# Patient Record
Sex: Male | Born: 1975 | Race: White | Hispanic: No | Marital: Married | Smoking: Never smoker
Health system: Southern US, Community
[De-identification: ages and names within clinical notes are randomized; demographics above are authoritative.]

## PROBLEM LIST (undated history)

## (undated) DIAGNOSIS — I10 Essential (primary) hypertension: Secondary | ICD-10-CM

## (undated) DIAGNOSIS — G4700 Insomnia, unspecified: Secondary | ICD-10-CM

## (undated) DIAGNOSIS — F419 Anxiety disorder, unspecified: Secondary | ICD-10-CM

## (undated) DIAGNOSIS — R030 Elevated blood-pressure reading, without diagnosis of hypertension: Secondary | ICD-10-CM

## (undated) DIAGNOSIS — E785 Hyperlipidemia, unspecified: Secondary | ICD-10-CM

## (undated) DIAGNOSIS — J309 Allergic rhinitis, unspecified: Secondary | ICD-10-CM

## (undated) HISTORY — PX: OTHER SURGICAL HISTORY: SHX169

## (undated) HISTORY — DX: Hyperlipidemia, unspecified: E78.5

## (undated) HISTORY — DX: Insomnia, unspecified: G47.00

## (undated) HISTORY — DX: Elevated blood-pressure reading, without diagnosis of hypertension: R03.0

## (undated) HISTORY — DX: Anxiety disorder, unspecified: F41.9

## (undated) HISTORY — DX: Allergic rhinitis, unspecified: J30.9

## (undated) HISTORY — DX: Essential (primary) hypertension: I10

---

## 2003-09-23 ENCOUNTER — Emergency Department (HOSPITAL_COMMUNITY): Admission: EM | Admit: 2003-09-23 | Discharge: 2003-09-24 | Payer: Self-pay | Admitting: Emergency Medicine

## 2003-10-29 ENCOUNTER — Emergency Department (HOSPITAL_COMMUNITY): Admission: EM | Admit: 2003-10-29 | Discharge: 2003-10-29 | Payer: Self-pay | Admitting: Emergency Medicine

## 2003-11-02 ENCOUNTER — Emergency Department (HOSPITAL_COMMUNITY): Admission: EM | Admit: 2003-11-02 | Discharge: 2003-11-02 | Payer: Self-pay | Admitting: Family Medicine

## 2003-11-06 ENCOUNTER — Emergency Department (HOSPITAL_COMMUNITY): Admission: EM | Admit: 2003-11-06 | Discharge: 2003-11-06 | Payer: Self-pay | Admitting: Family Medicine

## 2004-02-12 ENCOUNTER — Emergency Department (HOSPITAL_COMMUNITY): Admission: EM | Admit: 2004-02-12 | Discharge: 2004-02-12 | Payer: Self-pay | Admitting: Family Medicine

## 2005-07-21 ENCOUNTER — Emergency Department (HOSPITAL_COMMUNITY): Admission: EM | Admit: 2005-07-21 | Discharge: 2005-07-21 | Payer: Self-pay | Admitting: Emergency Medicine

## 2006-08-19 ENCOUNTER — Emergency Department: Payer: Self-pay | Admitting: General Practice

## 2007-05-17 ENCOUNTER — Ambulatory Visit: Payer: Self-pay | Admitting: Internal Medicine

## 2007-05-17 LAB — CONVERTED CEMR LAB
ALT: 23 units/L (ref 0–53)
AST: 18 units/L (ref 0–37)
Albumin: 4.1 g/dL (ref 3.5–5.2)
Alkaline Phosphatase: 68 units/L (ref 39–117)
BUN: 12 mg/dL (ref 6–23)
Basophils Absolute: 0 10*3/uL (ref 0.0–0.1)
Basophils Relative: 0.1 % (ref 0.0–1.0)
Bilirubin Urine: NEGATIVE
Bilirubin, Direct: 0.2 mg/dL (ref 0.0–0.3)
CO2: 31 meq/L (ref 19–32)
Calcium: 9.7 mg/dL (ref 8.4–10.5)
Chloride: 104 meq/L (ref 96–112)
Cholesterol: 178 mg/dL (ref 0–200)
Creatinine, Ser: 1.1 mg/dL (ref 0.4–1.5)
Eosinophils Absolute: 0.1 10*3/uL (ref 0.0–0.6)
Eosinophils Relative: 1.1 % (ref 0.0–5.0)
GFR calc Af Amer: 101 mL/min
GFR calc non Af Amer: 84 mL/min
Glucose, Bld: 89 mg/dL (ref 70–99)
HCT: 47.4 % (ref 39.0–52.0)
HDL: 37.7 mg/dL — ABNORMAL LOW (ref >39.0)
Hemoglobin, Urine: NEGATIVE
Hemoglobin: 16.3 g/dL (ref 13.0–17.0)
Ketones, ur: NEGATIVE mg/dL
LDL Cholesterol: 119 mg/dL — ABNORMAL HIGH (ref 0–99)
Leukocytes, UA: NEGATIVE
Lymphocytes Relative: 32.5 % (ref 12.0–46.0)
MCHC: 34.5 g/dL (ref 30.0–36.0)
MCV: 89.5 fL (ref 78.0–100.0)
Monocytes Absolute: 0.5 10*3/uL (ref 0.2–0.7)
Monocytes Relative: 8.2 % (ref 3.0–11.0)
Neutro Abs: 3.2 10*3/uL (ref 1.4–7.7)
Neutrophils Relative %: 58.1 % (ref 43.0–77.0)
Nitrite: NEGATIVE
Platelets: 209 10*3/uL (ref 150–400)
Potassium: 4.1 meq/L (ref 3.5–5.1)
RBC: 5.3 M/uL (ref 4.22–5.81)
RDW: 12 % (ref 11.5–14.6)
Sodium: 139 meq/L (ref 135–145)
Specific Gravity, Urine: 1.02 (ref 1.000–1.03)
TSH: 1.68 microintl units/mL (ref 0.35–5.50)
Total Bilirubin: 1.1 mg/dL (ref 0.3–1.2)
Total CHOL/HDL Ratio: 4.7
Total Protein, Urine: NEGATIVE mg/dL
Total Protein: 7 g/dL (ref 6.0–8.3)
Triglycerides: 109 mg/dL (ref 0–149)
Urine Glucose: NEGATIVE mg/dL
Urobilinogen, UA: 0.2 (ref 0.0–1.0)
VLDL: 22 mg/dL (ref 0–40)
WBC: 5.6 10*3/uL (ref 4.5–10.5)
pH: 7 (ref 5.0–8.0)

## 2007-05-18 ENCOUNTER — Ambulatory Visit: Payer: Self-pay | Admitting: Internal Medicine

## 2008-09-06 IMAGING — CT CT HEAD WITHOUT CONTRAST
3 series · 17 of 30 positions shown, 19 images · non-contrast
Comparison: none

REASON FOR EXAM: Head trauma
COMMENTS:

[Series 2: without · axial · non-contrast · 0.44mm/px · z∈[-132,-12]mm · 7 of 33 slices shown, 9 images]
[im 5/33  brain]
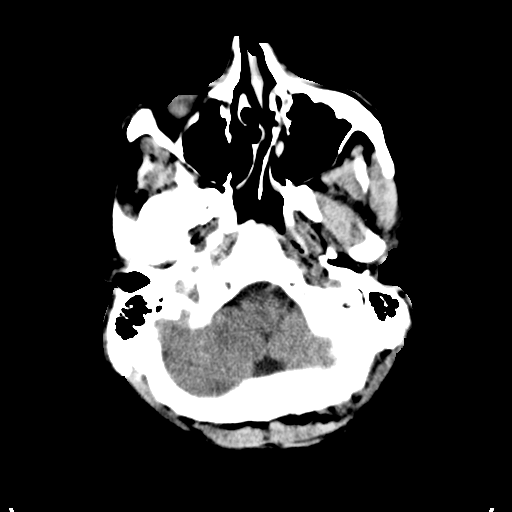
[im 5/33  bone]
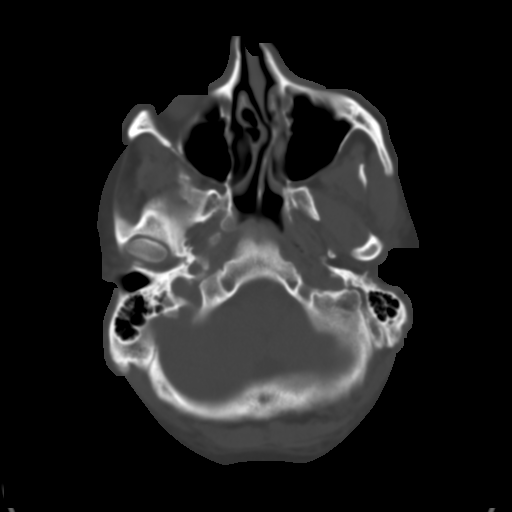
[im 9/33  brain]
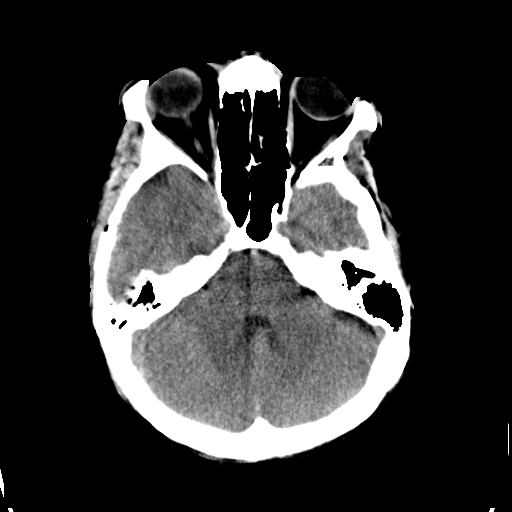
[im 13/33  brain]
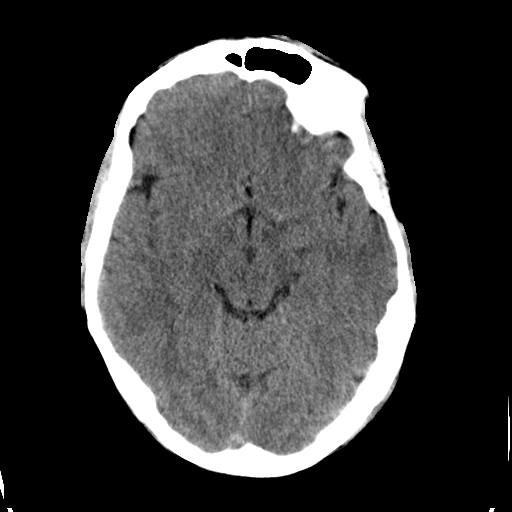
[im 17/33  brain]
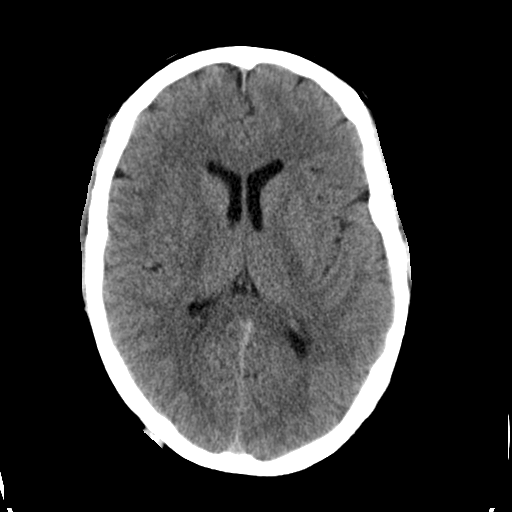
[im 21/33  brain]
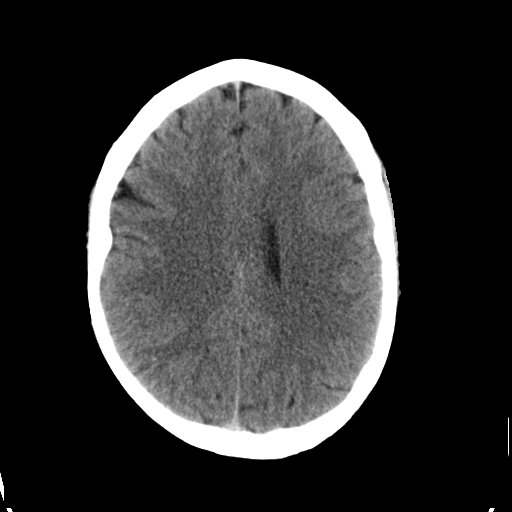
[im 21/33  bone]
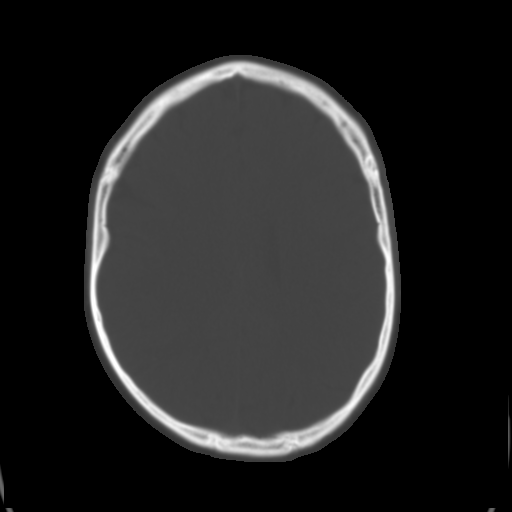
[im 25/33  brain]
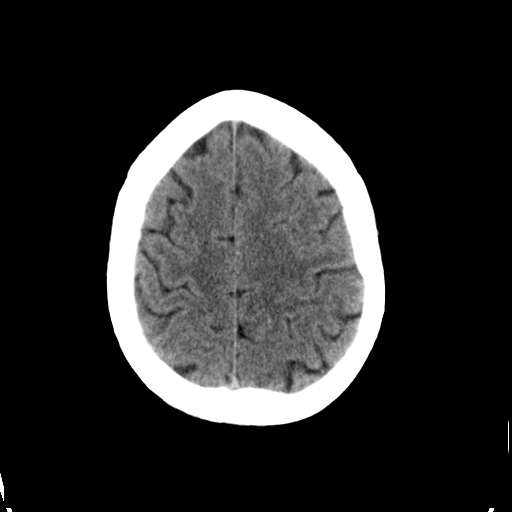
[im 29/33  brain]
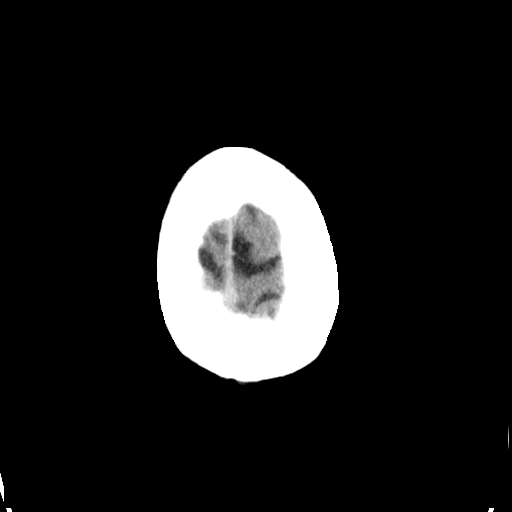

[Series 3: bone · axial · 0.44mm/px · z∈[-132,-17]mm · 6 of 33 slices shown (1 of 2)]
[im 5/33  bone]
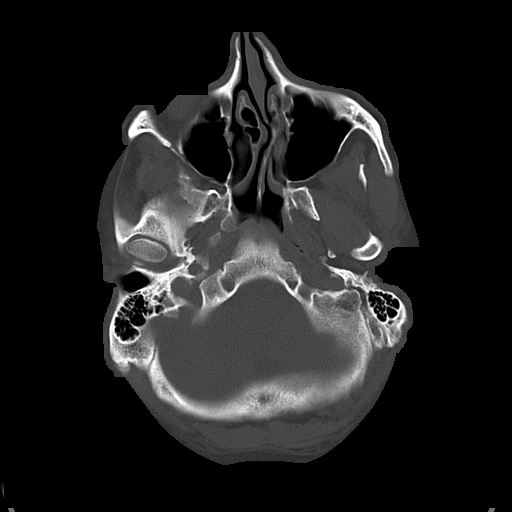
[im 10/33  bone]
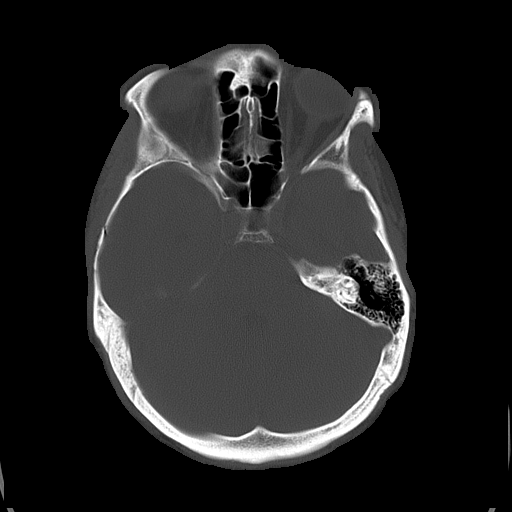
[im 14/33  bone]
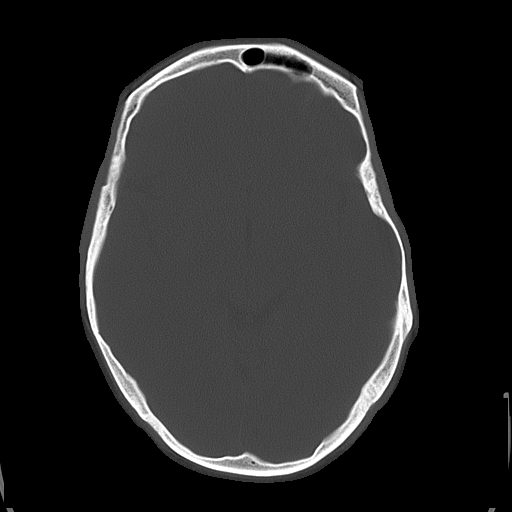
[im 19/33  bone]
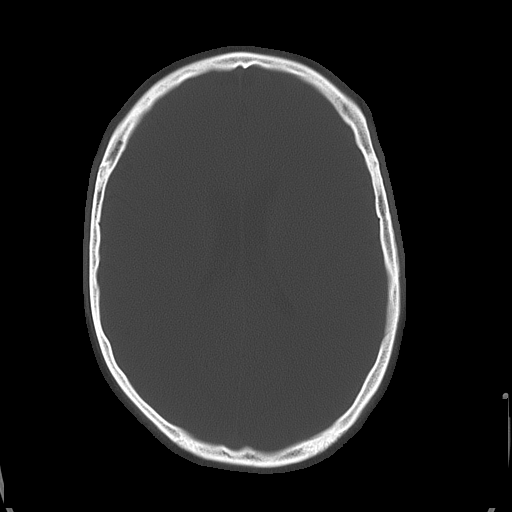
[im 23/33  bone]
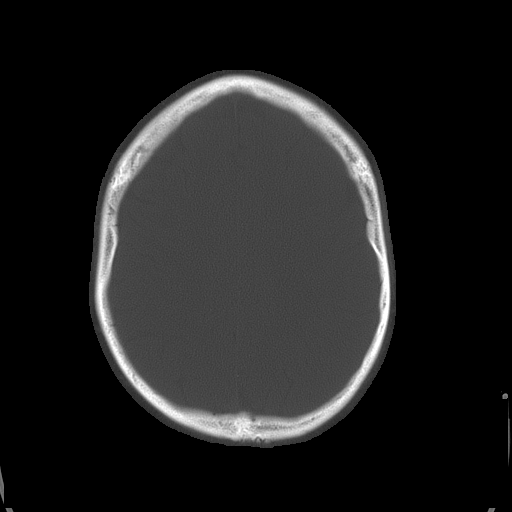
[im 28/33  bone]
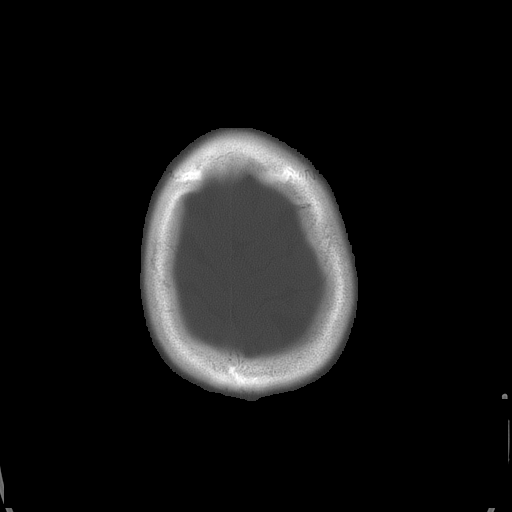

[Series 4: bone · axial · 0.44mm/px · z∈[-120,-108]mm · 4 of 52 slices shown (2 of 2)]
[im 5/52  bone]
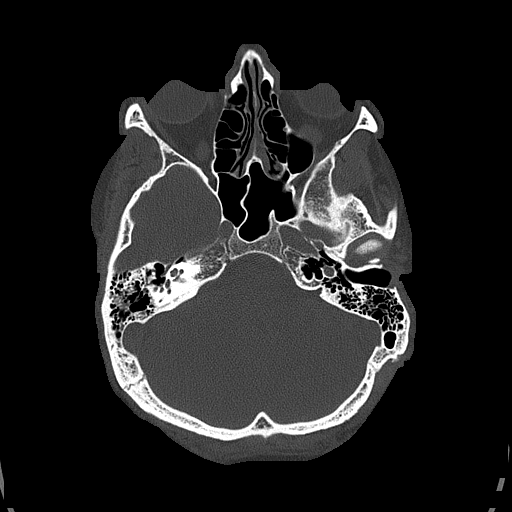
[im 13/52  bone]
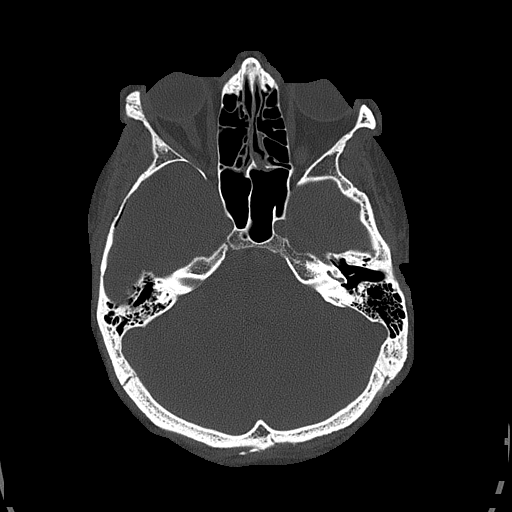
[im 18/52  bone]
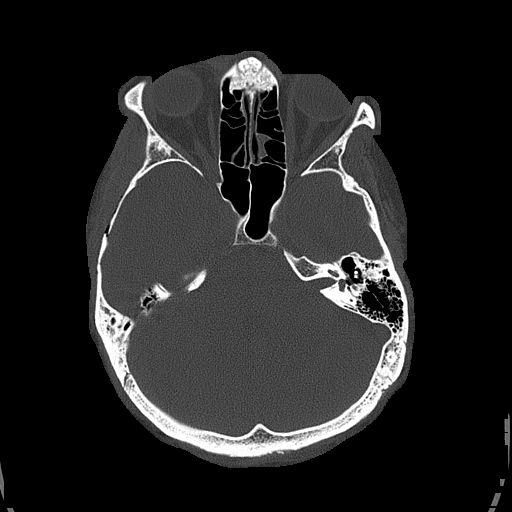
[im 22/52  bone]
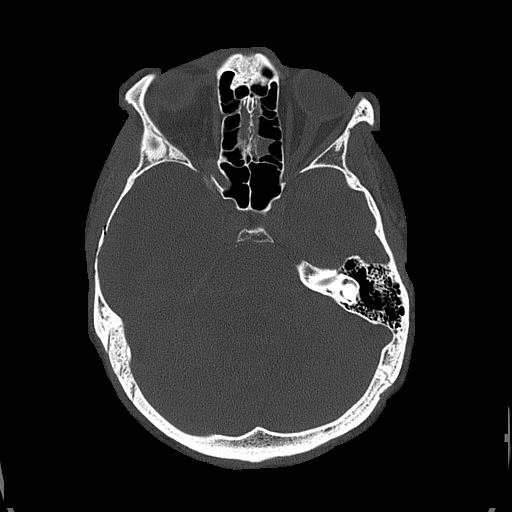

[17 of 30 positions shown; findings below may reference images not displayed]

PROCEDURE:     CT  - CT HEAD WITHOUT CONTRAST  - August 19, 2006  [DATE]

RESULT:     The patient has sustained head trauma.

The ventricles are normal in size and position. There is no intracranial
hemorrhage, mass or mass effect. At bone window settings, there is soft
tissue swelling and subcutaneous hematoma in the posterior parietal region.
There is evidence of a fracture involving the anterior aspect of the
parietal bone as well as the temporal bone on the RIGHT. This is
non-depressed. I see no abnormal density in the mastoid air cells. The
paranasal sinuses exhibit no air/fluid levels.
IMPRESSION: 1.     I do not see evidence of an acute intracranial hemorrhage or evidence
of intracranial edema. There is no hydrocephalus.
2.     There are findings consistent with a non-depressed fracture of the
temporoparietal bone on the RIGHT. This is predominantly anteriorly
positioned with respect to the area of subgaleal hematoma seen in the
posterior parietal lobe. Deep to this area of hematoma, I do not see
evidence of acute fracture.

The findings were called to Dr. Guerra at the conclusion of the study.

## 2009-06-02 ENCOUNTER — Ambulatory Visit: Payer: Self-pay | Admitting: Internal Medicine

## 2009-06-02 DIAGNOSIS — R030 Elevated blood-pressure reading, without diagnosis of hypertension: Secondary | ICD-10-CM | POA: Insufficient documentation

## 2009-06-02 DIAGNOSIS — J019 Acute sinusitis, unspecified: Secondary | ICD-10-CM | POA: Insufficient documentation

## 2009-06-02 HISTORY — DX: Elevated blood-pressure reading, without diagnosis of hypertension: R03.0

## 2009-06-04 DIAGNOSIS — J309 Allergic rhinitis, unspecified: Secondary | ICD-10-CM

## 2009-06-04 HISTORY — DX: Allergic rhinitis, unspecified: J30.9

## 2010-01-12 ENCOUNTER — Ambulatory Visit: Payer: Self-pay | Admitting: Internal Medicine

## 2010-01-12 DIAGNOSIS — R062 Wheezing: Secondary | ICD-10-CM | POA: Insufficient documentation

## 2010-03-29 ENCOUNTER — Ambulatory Visit: Payer: Self-pay | Admitting: Internal Medicine

## 2010-03-29 DIAGNOSIS — G4700 Insomnia, unspecified: Secondary | ICD-10-CM

## 2010-03-29 HISTORY — DX: Insomnia, unspecified: G47.00

## 2010-04-08 ENCOUNTER — Telehealth: Payer: Self-pay | Admitting: Internal Medicine

## 2010-10-14 NOTE — Assessment & Plan Note (Signed)
Summary: CHEST CONGESTION-EAR CONGESTION-COLD X 3 WKS--STC   Vital Signs:  Patient profile:   35 year old male Height:      72 inches Weight:      196.50 pounds BMI:     26.75 O2 Sat:      96 % on Room air Temp:     96.7 degrees F oral BP sitting:   122 / 86  (left arm) Cuff size:   regular  Vitals Entered ByZella Ball Ewing (Jan 12, 2010 2:37 PM)  O2 Flow:  Room air CC: chest congestion and cough/RE   CC:  chest congestion and cough/RE.  History of Present Illness: getting married this weekend wtih honeymoon in [place];  has had mild nasal allergy symtpoms in the past few wks, but today here with 2 to 3 days acute onset mild to mod fever, slight ST, and non prod cough, without CP, sob, doe, wheezing, orthopnea, pnd, worsening LE edema, palps, dizziness or syncope .  Could not sleep well last night.  Had onset midl wheezing and tightness today.  Pt denies new neuro symptoms such as headache, facial or extremity weakness   Problems Prior to Update: 1)  Elevated Blood Pressure Without Diagnosis of Hypertension  (ICD-796.2) 2)  Allergic Rhinitis  (ICD-477.9) 3)  Sinusitis- Acute-nos  (ICD-461.9)  Medications Prior to Update: 1)  Clarithromycin 500 Mg Tabs (Clarithromycin) .Marland Kitchen.. 1  By Mouth Two Times A Day 2)  Promethazine-Codeine 6.25-10 Mg/64ml Syrp (Promethazine-Codeine) .Marland Kitchen.. 1 Tsp By Mouth Q 6 Hrs As Needed Cough  Current Medications (verified): 1)  Clarithromycin 500 Mg Tabs (Clarithromycin) .Marland Kitchen.. 1  By Mouth Two Times A Day 2)  Hydrocodone-Homatropine 5-1.5 Mg/1ml Syrp (Hydrocodone-Homatropine) .Marland Kitchen.. 1 Tsp By Mouth Q 6 Hrs As Needed 3)  Prednisone 10 Mg Tabs (Prednisone) .... 3po Qd For 3days, Then 2po Qd For 3days, Then 1po Qd For 3days, Then Stop  Allergies (verified): 1)  ! 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  , mild ill  Head:  normocephalic and atraumatic.   Eyes:  vision grossly intact, pupils equal, and pupils round.   Ears:  bilat tm's mild red, sinus nontender Nose:  nasal dischargemucosal pallor and mucosal edema.   Mouth:  pharyngeal erythema and fair dentition.   Neck:  supple and no masses.   Lungs:  normal respiratory effort, R decreased breath sounds, R wheezes, L decreased breath sounds, and L wheezes.   Heart:  normal rate and regular rhythm.   Extremities:  no edema, no erythema    Impression & Recommendations:  Problem # 1:  BRONCHITIS-ACUTE (ICD-466.0)  His updated medication list for this problem includes:    Clarithromycin 500 Mg Tabs (Clarithromycin) .Marland Kitchen... 1  by mouth two times a day    Hydrocodone-homatropine 5-1.5 Mg/5ml Syrp (Hydrocodone-homatropine) .Marland Kitchen... 1 tsp by mouth q 6 hrs as needed treat as above, f/u any worsening signs or symptoms   Problem # 2:  WHEEZING (ICD-786.07) mild, likely due to above, tx with prednisone taper off  Problem # 3:  ALLERGIC RHINITIS (ICD-477.9) mild,  for claritin otc as needed   Complete Medication List: 1)  Clarithromycin 500 Mg Tabs (Clarithromycin) .Marland Kitchen.. 1  by mouth two times a day 2)  Hydrocodone-homatropine 5-1.5 Mg/16ml Syrp (Hydrocodone-homatropine) .Marland Kitchen.. 1 tsp by mouth q 6 hrs as needed 3)  Prednisone 10 Mg Tabs (Prednisone) .... 3po qd for 3days, then 2po qd for 3days, then 1po qd for 3days, then stop  Patient Instructions: 1)  Please take all new medications as prescribed 2)  Continue all previous medications as before this visit  3)  Please schedule a follow-up appointment as needed. Prescriptions: PREDNISONE 10 MG TABS  (PREDNISONE) 3po qd for 3days, then 2po qd for 3days, then 1po qd for 3days, then stop  #18 x 0   Entered and Authorized by:   Corwin Levins MD   Signed by:   Corwin Levins MD on 01/12/2010   Method used:   Print then Give to Patient   RxID:   [id number] HYDROCODONE-HOMATROPINE 5-1.5 MG/5ML SYRP (HYDROCODONE-HOMATROPINE) 1 tsp by mouth q 6 hrs as needed  #6oz x 1   Entered and Authorized by:   Corwin Levins MD   Signed by:   Corwin Levins MD on 01/12/2010   Method used:   Print then Give to Patient   RxID:   [id number] CLARITHROMYCIN 500 MG TABS (CLARITHROMYCIN) 1  by mouth two times a day  #20 x 0   Entered and Authorized by:   Corwin Levins MD   Signed by:   Corwin Levins MD on 01/12/2010   Method used:   Print then Give to Patient   RxID:   [id number]

## 2010-10-14 NOTE — Assessment & Plan Note (Signed)
Summary: chest cold,sinus?/cd   Vital Signs:  Patient profile:   35 year old male Height:      72 inches Weight:      200 pounds BMI:     27.22 O2 Sat:      97 % on Room air Temp:     97.7 degrees F oral Pulse rate:   54 / minute BP sitting:   110 / 82  (left arm) Cuff size:   large  Vitals Entered By: Zella Ball Ewing CMA Duncan Dull) (March 29, 2010 4:18 PM)  O2 Flow:  Room air CC: Chest cold, wheezing, cough/RE   CC:  Chest cold, wheezing, and cough/RE.  History of Present Illness: here with acute onset mild to mod for 3 days head congestion, ST, fever and today with worsening prod cough greenish sputum; Pt denies CP, sob, doe, wheezing, orthopnea, pnd, worsening LE edema, palps, dizziness or syncope  Pt denies new neuro symptoms such as headache, facial or extremity weakness, but cant sleep well due to the cough.  No wt loss, night sweats, loss of appetite or other constitutional symptoms .  No worsening depressikve symptoms or suicidal ideation or panic.  Somewhtat worried about wheezing  Preventive Screening-Counseling & Management      Drug Use:  no.    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 1  By Mouth Two Times A Day 2)  Hydrocodone-Homatropine 5-1.5 Mg/52ml Syrp (Hydrocodone-Homatropine) .Marland Kitchen.. 1 Tsp By Mouth Q 6 Hrs As Needed 3)  Prednisone 10 Mg Tabs (Prednisone) .... 3po Qd For 3days, Then 2po Qd For 3days, Then 1po Qd For 3days, Then Stop  Current Medications (verified): 1)  Cephalexin 500 Mg Caps (Cephalexin) .Marland Kitchen.. 1 By Mouth Three Times A Day 2)  Hydrocodone-Homatropine 5-1.5 Mg/61ml Syrp (Hydrocodone-Homatropine) .Marland Kitchen.. 1 Tsp By Mouth Q 6 Hrs As  Needed 3)  Zolpidem Tartrate 10 Mg Tabs (Zolpidem Tartrate) .Marland Kitchen.. 1 By Mouth At Bedtime As Needed  Allergies (verified): 1)  ! 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  , mild ill  Head:  normocephalic and atraumatic.   Eyes:  vision grossly intact, pupils equal, and pupils round.   Ears:  bilat tm'smild red, sinus nontender Nose:  nasal dischargemucosal pallor and mucosal edema.   Mouth:  pharyngeal erythema and fair dentition.   Neck:  supple and cervical lymphadenopathy.   Lungs:  normal respiratory effort and normal breath sounds.   Heart:  normal rate and regular rhythm.   Extremities:  no edema, no erythema    Impression & Recommendations:  Problem # 1:  BRONCHITIS-ACUTE (  ICD-466.0)  His updated medication list for this problem includes:    Cephalexin 500 Mg Caps (Cephalexin) .Marland Kitchen... 1 by mouth three times a day    Hydrocodone-homatropine 5-1.5 Mg/98ml Syrp (Hydrocodone-homatropine) .Marland Kitchen... 1 tsp by mouth q 6 hrs as needed treat as above, f/u any worsening signs or symptoms   Problem # 2:  WHEEZING (ICD-786.07) none this time - ok to hold prednisone for now  Problem # 3:  INSOMNIA-SLEEP DISORDER-UNSPEC (ICD-780.52)  His updated medication list for this problem includes:    Zolpidem Tartrate 10 Mg Tabs (Zolpidem tartrate)  .Marland Kitchen... 1 by mouth at bedtime as needed treat as above, f/u any worsening signs or symptoms   Complete Medication List: 1)  Cephalexin 500 Mg Caps (Cephalexin) .Marland Kitchen.. 1 by mouth three times a day 2)  Hydrocodone-homatropine 5-1.5 Mg/59ml Syrp (Hydrocodone-homatropine) .Marland Kitchen.. 1 tsp by mouth q 6 hrs as needed 3)  Zolpidem Tartrate 10 Mg Tabs (Zolpidem tartrate) .Marland Kitchen.. 1 by mouth at bedtime as needed  Patient Instructions: 1)  Please take all new medications as prescribed  2)  Continue all previous medications as before this visit  3)  Please schedule a follow-up appointment as needed. Prescriptions: ZOLPIDEM TARTRATE 10 MG TABS (ZOLPIDEM TARTRATE) 1 by mouth at bedtime as needed  #30 x 2   Entered and Authorized by:   Corwin Levins MD   Signed by:   Corwin Levins MD on 03/29/2010   Method used:   Print then Give to Patient   RxID:   [id number] HYDROCODONE-HOMATROPINE 5-1.5 MG/5ML SYRP (HYDROCODONE-HOMATROPINE) 1 tsp by mouth q 6 hrs as needed  #6oz x 1   Entered and Authorized by:   Corwin Levins MD   Signed by:   Corwin Levins MD on 03/29/2010   Method used:   Print then Give to Patient   RxID:   [id number] CEPHALEXIN 500 MG CAPS (CEPHALEXIN) 1 by mouth three times a day  #30 x 0   Entered and Authorized by:   Corwin Levins MD   Signed by:   Corwin Levins MD on 03/29/2010   Method used:   Print then Give to Patient   RxID:   [id number]

## 2010-10-14 NOTE — Progress Notes (Signed)
Summary: ABX?  Phone Note Call from Patient Call back at Home Phone [phone]   Caller: Patient Call For: Corwin Levins MD Summary of Call: Pt was given antibiotic 7/18,CEPHALEXIN -- pt states he still has chest congestion and does not think medicine has worked. Can he get another that might work better? Initial call taken by: Verdell Face,  April 08, 2010 2:49 PM  Follow-up for Phone Call        Please advise? Margaret Pyle, CMA  April 08, 2010 3:11 PM   Additional Follow-up for Phone Call Additional follow up Details #1::        ok to change to zpack - done hardcopy to LIM side B - dahlia   Additional Follow-up by: Corwin Levins MD,  April 08, 2010 3:19 PM    Additional Follow-up for Phone Call Additional follow up Details #2::    left message on machine for pt to return my call. Margaret Pyle, CMA  April 08, 2010 3:28 PM   Patient left message on triage giving a pharmacy address, but did not say what pharmacy it is. Left message on voicemail to call back with pharmacy name and city it is located in. [facility]  April 08, 2010 3:49 PM     Additional Follow-up for Phone Call Additional follow up Details #3:: Details for Additional Follow-up Action Taken: called pt left msg. to call back and give the pharmacy he would like his prescription sent to.  Pt called back stating that he uses [facility] on [address] in [place]. Rx faxed to pharmacy Margaret Pyle, CMA  April 08, 2010 4:35 PM  Additional Follow-up by: Zella Ball Ewing CMA Duncan Dull),  April 08, 2010 4:25 PM  New/Updated Medications: AZITHROMYCIN 250 MG TABS (AZITHROMYCIN) 2po qd for 1 day, then 1po qd for 4days, then stop Prescriptions: AZITHROMYCIN 250 MG TABS (AZITHROMYCIN) 2po qd for 1 day, then 1po qd for 4days, then stop  #6 x 1   Entered and Authorized by:   Corwin Levins MD   Signed by:   Corwin Levins MD on 04/08/2010   Method used:   Print then Give to Patient   RxID:    [id number]

## 2011-01-28 NOTE — Op Note (Signed)
NAME:  Carlos Whitney, Carlos Whitney NO.:  [id number]   MEDICAL RECORD NO.:  [id number]          PATIENT TYPE:  EMS   LOCATION:  [facility]                         FACILITY:  [facility]   PHYSICIAN:  Nadara Mustard, MD     DATE OF BIRTH:  [birth year]   DATE OF PROCEDURE:  07/21/2005  DATE OF DISCHARGE:                                 OPERATIVE REPORT   HISTORY OF PRESENT ILLNESS:  The patient is a 35 year old gentleman who was  at work, was working with the rollers when his finger got caught and  amputated the distal aspect of his right index finger on two metal rollers  while at work. The patient was brought by EMS for evaluation and treatment  and the patient is seen for in consultation for Dr. Lorre Nick. The  patient's past medical history is essentially unremarkable. Tetanus is not  up-to-date and a tetanus booster was given. The patient had no drug  allergies. He was given a gram of Kefzol IV.   SOCIAL HISTORY:  Positive for EtOH socially, drug use none. History of  smoking none.   PAST MEDICAL HISTORY:  Negative.   PAST MEDICAL USE:  None.   REVIEW OF SYSTEMS:  Negative for substernal chest pain, negative shortness  of breath, negative for any other injuries, negative for loss of  consciousness, negative for fall.   PHYSICAL EXAM:  Temperature 98.1, blood pressure 155/75, pulse 68,  respiratory rate 18. O2 sats 100% room air. Physical examination, the  patient has a complete amputation of all the soft tissue from the right  index finger just proximal to the nail fold distally. The complete soft  tissue has been avulsed with exposed bone from the proximal to the nail base  distally. Radiograph shows no fracture but a complete soft tissue loss and  there is more tissue loss on the palmar aspect with the palmar soft tissue  loss proximal to the distal crease at the DIP joint. The patient's remainder  of his hand is neurovascularly intact.   ASSESSMENT:  Crush amputation  right index finger proximal to the nail fold  in a right-hand dominant gentleman.   PLAN:  The patient underwent a digital block with 20 mL of 1% lidocaine  plain. The hand was scrubbed using Betadine paint and scrub and draped into  a sterile field. The soft tissue avulsions were cleansed. The bone was  rongeured back to an area to allow for soft tissue coverage. Do to the fact  that the patient did not have any a volar tissue, two lateral soft tissue  flaps were advanced and with the lateral flaps advanced in a crossing  fashion, this was allowed to cover the distal defect and the more dorsal  skin flap was also through the palmarly. This was closed using 3-0 nylon.  There was still some small areas of granulation tissue but I feel that this  should granulate in well. The  patient was placed in a sterile dressing with Xeroform, 4x4s, Kerlix and  Coban. The patient will be discharged to home in stable condition. He is  given a  prescription for Keflex 500 mg p.o. t.i.d. and his prescription for  Vicodin. Plan to follow up in the office in one week.  He was given  instructions for ice, elevation.      Nadara Mustard, MD  Electronically Signed     MVD/MEDQ  D:  07/21/2005  T:  07/22/2005  Job:  [id number]

## 2011-06-27 ENCOUNTER — Encounter: Payer: Self-pay | Admitting: Internal Medicine

## 2011-06-27 DIAGNOSIS — Z0000 Encounter for general adult medical examination without abnormal findings: Secondary | ICD-10-CM | POA: Insufficient documentation

## 2011-06-29 ENCOUNTER — Other Ambulatory Visit: Payer: Self-pay | Admitting: Internal Medicine

## 2011-06-29 ENCOUNTER — Ambulatory Visit (INDEPENDENT_AMBULATORY_CARE_PROVIDER_SITE_OTHER): Payer: BC Managed Care – PPO | Admitting: Internal Medicine

## 2011-06-29 ENCOUNTER — Encounter: Payer: Self-pay | Admitting: Internal Medicine

## 2011-06-29 ENCOUNTER — Other Ambulatory Visit (INDEPENDENT_AMBULATORY_CARE_PROVIDER_SITE_OTHER): Payer: BC Managed Care – PPO

## 2011-06-29 VITALS — BP 142/98 | HR 66 | Temp 97.4°F | Ht 67.0 in | Wt 205.0 lb

## 2011-06-29 DIAGNOSIS — I10 Essential (primary) hypertension: Secondary | ICD-10-CM

## 2011-06-29 DIAGNOSIS — Z0000 Encounter for general adult medical examination without abnormal findings: Secondary | ICD-10-CM

## 2011-06-29 HISTORY — DX: Essential (primary) hypertension: I10

## 2011-06-29 LAB — CBC WITH DIFFERENTIAL/PLATELET
Basophils Absolute: 0 10*3/uL (ref 0.0–0.1)
Basophils Relative: 0.5 % (ref 0.0–3.0)
Eosinophils Absolute: 0.1 10*3/uL (ref 0.0–0.7)
Eosinophils Relative: 1.4 % (ref 0.0–5.0)
HCT: 44 % (ref 39.0–52.0)
Lymphocytes Relative: 39.8 % (ref 12.0–46.0)
Lymphs Abs: 2.6 10*3/uL (ref 0.7–4.0)
MCHC: 33.6 g/dL (ref 30.0–36.0)
MCV: 90.2 fl (ref 78.0–100.0)
Monocytes Absolute: 0.5 10*3/uL (ref 0.1–1.0)
Neutro Abs: 3.3 10*3/uL (ref 1.4–7.7)
Neutrophils Relative %: 50.8 % (ref 43.0–77.0)
Platelets: 187 10*3/uL (ref 150.0–400.0)
RBC: 4.88 Mil/uL (ref 4.22–5.81)
RDW: 12.8 % (ref 11.5–14.6)
WBC: 6.4 10*3/uL (ref 4.5–10.5)

## 2011-06-29 LAB — BASIC METABOLIC PANEL
BUN: 19 mg/dL (ref 6–23)
CO2: 27 mEq/L (ref 19–32)
Chloride: 101 mEq/L (ref 96–112)
Creatinine, Ser: 1.2 mg/dL (ref 0.4–1.5)
GFR: 77.09 mL/min (ref >60.00)
Glucose, Bld: 85 mg/dL (ref 70–99)
Potassium: 3.9 mEq/L (ref 3.5–5.1)
Sodium: 135 mEq/L (ref 135–145)

## 2011-06-29 LAB — URINALYSIS, ROUTINE W REFLEX MICROSCOPIC
Bilirubin Urine: NEGATIVE
Hgb urine dipstick: NEGATIVE
Leukocytes, UA: NEGATIVE
Nitrite: NEGATIVE
Specific Gravity, Urine: <=1.005 (ref 1.000–1.030)
Total Protein, Urine: NEGATIVE
Urine Glucose: NEGATIVE
pH: 6 (ref 5.0–8.0)

## 2011-06-29 LAB — HEPATIC FUNCTION PANEL
ALT: 28 U/L (ref 0–53)
AST: 18 U/L (ref 0–37)
Albumin: 4.4 g/dL (ref 3.5–5.2)
Alkaline Phosphatase: 54 U/L (ref 39–117)
Bilirubin, Direct: 0.1 mg/dL (ref 0.0–0.3)
Total Bilirubin: 0.7 mg/dL (ref 0.3–1.2)
Total Protein: 7 g/dL (ref 6.0–8.3)

## 2011-06-29 LAB — LIPID PANEL
Cholesterol: 168 mg/dL (ref 0–200)
HDL: 38.8 mg/dL — ABNORMAL LOW (ref >39.00)
Total CHOL/HDL Ratio: 4
VLDL: 45.2 mg/dL — ABNORMAL HIGH (ref 0.0–40.0)

## 2011-06-29 MED ORDER — VALSARTAN 160 MG PO TABS: 160.0000 mg | ORAL_TABLET | Freq: Every day | ORAL | Status: DC

## 2011-06-29 NOTE — Assessment & Plan Note (Signed)

## 2011-06-29 NOTE — Patient Instructions (Signed)
Your EKG was ok today Take all new medications as prescribed - the Diovan generic at 160 mg per day If you can, please check your BP about every other day, different times of the day, at home Continue all other medications as before, including the meloxicam for now Please get regular exercise, low salt diet, lower calories, lower chollesterol diet, and wt loss Please go to LAB in the Basement for the blood and/or urine tests to be done today Please call the phone number [phone] (the PhoneTree System) for results of testing in 2-3 days;  When calling, simply dial the number, and when prompted enter the MRN number above (the Medical Record Number) and the # key, then the message should start. Please return in 1 month

## 2011-06-29 NOTE — Assessment & Plan Note (Addendum)
New onset sustained possibly related to recent increased wt 188 to current, for lifestyle changes and low salt, wt loss, start dovan 160 qd,cancer ECG reviewed as per emr, f/u BP at home qod and next visit  BP Readings from Last 3 Encounters:  06/29/11 142/98  03/29/10 110/82  01/12/10 122/86

## 2011-06-30 ENCOUNTER — Encounter: Payer: Self-pay | Admitting: Internal Medicine

## 2011-06-30 LAB — LDL CHOLESTEROL, DIRECT: Direct LDL: 109.3 mg/dL

## 2011-06-30 NOTE — Progress Notes (Signed)
  Subjective:    Patient ID: Carlos Whitney, male    DOB: [birth year], 35 y.o.   MRN: [id number]  HPI  Here for wellness and f/u;  Overall doing ok;  Pt denies CP, worsening SOB, DOE, wheezing, orthopnea, PND, worsening LE edema, palpitations, dizziness or syncope.  Pt denies neurological change such as new Headache, facial or extremity weakness.  Pt denies polydipsia, polyuria, or low sugar symptoms. Pt states overall good compliance with treatment and medications, good tolerability, and trying to follow lower cholesterol diet.  Pt denies worsening depressive symptoms, suicidal ideation or panic. No fever, wt loss, night sweats, loss of appetite, or other constitutional symptoms.  Pt states good ability with ADL's, low fall risk, home safety reviewed and adequate, no significant changes in hearing or vision, and occasionally active with exercise.  BP has been noted several times elevated recently > 140/90. Past Medical History  Diagnosis Date  . ALLERGIC RHINITIS 06/04/2009  . ELEVATED BLOOD PRESSURE WITHOUT DIAGNOSIS OF HYPERTENSION 06/02/2009  . INSOMNIA-SLEEP DISORDER-UNSPEC 03/29/2010  . HTN (hypertension) 06/29/2011   No past surgical history on file.  reports that he has never smoked. He does not have any smokeless tobacco history on file. His alcohol and drug histories not on file. family history is not on file. Allergies  Allergen Reactions  . Codeine    No current outpatient prescriptions on file prior to visit.   Review of Systems Review of Systems  Constitutional: Negative for diaphoresis, activity change, appetite change and unexpected weight change.  HENT: Negative for hearing loss, ear pain, facial swelling, mouth sores and neck stiffness.   Eyes: Negative for pain, redness and visual disturbance.  Respiratory: Negative for shortness of breath and wheezing.   Cardiovascular: Negative for chest pain and palpitations.  Gastrointestinal: Negative for diarrhea, blood in stool,  abdominal distention and rectal pain.  Genitourinary: Negative for hematuria, flank pain and decreased urine volume.  Musculoskeletal: Negative for myalgias and joint swelling.  Skin: Negative for color change and wound.  Neurological: Negative for syncope and numbness.  Hematological: Negative for adenopathy.  Psychiatric/Behavioral: Negative for hallucinations, self-injury, decreased concentration and agitation.      Objective:   Physical Exam BP 142/98  Pulse 66  Temp(Src) 97.4 F (36.3 C) (Oral)  Ht 5\' 7"  (1.702 m)  Wt 205 lb (92.987 kg)  BMI 32.11 kg/m2  SpO2 97% Physical Exam  VS noted Constitutional: Pt is oriented to person, place, and time. Appears well-developed and well-nourished.  HENT:  Head: Normocephalic and atraumatic.  Right Ear: External ear normal.  Left Ear: External ear normal.  Nose: Nose normal.  Mouth/Throat: Oropharynx is clear and moist.  Eyes: Conjunctivae and EOM are normal. Pupils are equal, round, and reactive to light.  Neck: Normal range of motion. Neck supple. No JVD present. No tracheal deviation present.  Cardiovascular: Normal rate, regular rhythm, normal heart sounds and intact distal pulses.   Pulmonary/Chest: Effort normal and breath sounds normal.  Abdominal: Soft. Bowel sounds are normal. There is no tenderness.  Musculoskeletal: Normal range of motion. Exhibits no edema.  Lymphadenopathy:  Has no cervical adenopathy.  Neurological: Pt is alert and oriented to person, place, and time. Pt has normal reflexes. No cranial nerve deficit.  Skin: Skin is warm and dry. No rash noted.  Psychiatric:  Has  normal mood and affect. Behavior is normal.     Assessment & Plan:

## 2011-07-19 ENCOUNTER — Encounter: Payer: Self-pay | Admitting: Internal Medicine

## 2011-07-25 ENCOUNTER — Telehealth: Payer: Self-pay

## 2011-07-25 MED ORDER — LOSARTAN POTASSIUM 100 MG PO TABS: 100.0000 mg | ORAL_TABLET | Freq: Every day | ORAL | Status: DC

## 2011-07-25 NOTE — Telephone Encounter (Signed)
Ok to change the diovan to losartan 100 qd

## 2011-07-25 NOTE — Telephone Encounter (Signed)
Patient called to inform Diovan is a tier 3 medication and too expensive, please adivse on less expensive medication.

## 2011-07-26 NOTE — Telephone Encounter (Signed)
Called the patient left message medication change has been done.

## 2011-08-01 ENCOUNTER — Ambulatory Visit: Payer: BC Managed Care – PPO | Admitting: Internal Medicine

## 2011-08-25 ENCOUNTER — Ambulatory Visit (INDEPENDENT_AMBULATORY_CARE_PROVIDER_SITE_OTHER): Payer: BC Managed Care – PPO

## 2011-08-25 DIAGNOSIS — H9209 Otalgia, unspecified ear: Secondary | ICD-10-CM

## 2011-08-25 DIAGNOSIS — Z23 Encounter for immunization: Secondary | ICD-10-CM

## 2011-08-25 DIAGNOSIS — B9789 Other viral agents as the cause of diseases classified elsewhere: Secondary | ICD-10-CM

## 2011-08-28 ENCOUNTER — Ambulatory Visit (INDEPENDENT_AMBULATORY_CARE_PROVIDER_SITE_OTHER): Payer: BC Managed Care – PPO

## 2011-08-28 DIAGNOSIS — B9789 Other viral agents as the cause of diseases classified elsewhere: Secondary | ICD-10-CM

## 2012-08-05 ENCOUNTER — Other Ambulatory Visit: Payer: Self-pay | Admitting: Internal Medicine

## 2012-09-11 ENCOUNTER — Ambulatory Visit (INDEPENDENT_AMBULATORY_CARE_PROVIDER_SITE_OTHER): Payer: BC Managed Care – PPO | Admitting: Family Medicine

## 2012-09-11 VITALS — BP 140/90 | HR 62 | Temp 97.9°F | Resp 16 | Ht 71.0 in | Wt 222.0 lb

## 2012-09-11 DIAGNOSIS — E785 Hyperlipidemia, unspecified: Secondary | ICD-10-CM

## 2012-09-11 DIAGNOSIS — R079 Chest pain, unspecified: Secondary | ICD-10-CM

## 2012-09-11 DIAGNOSIS — I10 Essential (primary) hypertension: Secondary | ICD-10-CM

## 2012-09-11 LAB — LIPID PANEL
Cholesterol: 175 mg/dL (ref 0–200)
HDL: 37 mg/dL — ABNORMAL LOW (ref >39)
LDL Cholesterol: 93 mg/dL (ref 0–99)
Total CHOL/HDL Ratio: 4.7 Ratio
Triglycerides: 226 mg/dL — ABNORMAL HIGH (ref <150)
VLDL: 45 mg/dL — ABNORMAL HIGH (ref 0–40)

## 2012-09-11 LAB — COMPREHENSIVE METABOLIC PANEL
ALT: 58 U/L — ABNORMAL HIGH (ref 0–53)
AST: 24 U/L (ref 0–37)
BUN: 13 mg/dL (ref 6–23)
CO2: 29 mEq/L (ref 19–32)
Chloride: 102 mEq/L (ref 96–112)
Creat: 1 mg/dL (ref 0.50–1.35)
Glucose, Bld: 88 mg/dL (ref 70–99)
Potassium: 4.6 mEq/L (ref 3.5–5.3)
Sodium: 140 mEq/L (ref 135–145)
Total Bilirubin: 0.7 mg/dL (ref 0.3–1.2)
Total Protein: 6.5 g/dL (ref 6.0–8.3)

## 2012-09-11 LAB — POCT CBC
Granulocyte percent: 50.3 %G (ref 37–80)
HCT, POC: 50.7 % (ref 43.5–53.7)
Lymph, poc: 2.6 (ref 0.6–3.4)
MCH, POC: 30.4 pg (ref 27–31.2)
MCHC: 32.7 g/dL (ref 31.8–35.4)
MID (cbc): 0.4 (ref 0–0.9)
MPV: 9.9 fL (ref 0–99.8)
POC LYMPH PERCENT: 43.2 %L (ref 10–50)
POC MID %: 6.5 %M (ref 0–12)
Platelet Count, POC: 221 10*3/uL (ref 142–424)
RDW, POC: 12.8 %
WBC: 6.1 10*3/uL (ref 4.6–10.2)

## 2012-09-11 MED ORDER — LOSARTAN POTASSIUM 100 MG PO TABS: 100.0000 mg | ORAL_TABLET | Freq: Every day | ORAL | Status: DC

## 2012-09-11 NOTE — Progress Notes (Signed)
[facility] [address], [place] [address] [id number] [phone]  Date:  09/11/2012   Name:  Carlos Whitney   DOB:  [birth year]   MRN:  [id number]  PCP:  Oliver Barre, MD    Chief Complaint: Hypertension   History of Present Illness:  Carlos Whitney is a 36 y.o. very pleasant male patient who presents with the following:  He is here today to discuss his BP medication.  He could not get in with his PCP and he needs a refill of his medication today.  He has been laid off and today is the last day he will have insurance until he gets a new job.   He also noted a small nodule on the extensor surface of his left index finger- it is noticeable when the finger is flexed.  It is not really painful but he thought he should have it evaluated.   He does not check his BP as much as he did at first so he is not sure how it is running at home.  His mother has a histoty of HTN- no known history of CAD in the family He has never been a smoker He does participate in sports a lot in the spring and summer.  This time of year he is less active  He also mentions that for the last 6 weeks or so he has noted some left sided upper chest/ shoulder discomfoort that may occur twice a week and last for about 15 minutes.  It seems to occur after eating, especially if "I eat bad."  If he is active the pain actually gets better.  He last had this pain last night- none today, CP free right now. No CP with exercise- at his job he would be on his feet all day moving around without a problem.  He states that the feeling is not really pain, but just something he thought he should mention.     Patient Active Problem List  Diagnosis  . ALLERGIC RHINITIS  . INSOMNIA-SLEEP DISORDER-UNSPEC  . Preventative health care  . HTN (hypertension)    Past Medical History  Diagnosis Date  . ALLERGIC RHINITIS 06/04/2009  . ELEVATED BLOOD PRESSURE WITHOUT DIAGNOSIS OF HYPERTENSION 06/02/2009  . INSOMNIA-SLEEP  DISORDER-UNSPEC 03/29/2010  . HTN (hypertension) 06/29/2011  . Anxiety     Past Surgical History  Procedure Date  . Partial index finguer amputation     work accident    History  Substance Use Topics  . Smoking status: Never Smoker   . Smokeless tobacco: Not on file     Comment: married x 2 (2011)  . Alcohol Use: No    History reviewed. No pertinent family history.  Allergies  Allergen Reactions  . Codeine     Medication list has been reviewed and updated.  Current Outpatient Prescriptions on File Prior to Visit  Medication Sig Dispense Refill  . losartan (COZAAR) 100 MG tablet TAKE 1 TABLET BY MOUTH EVERY DAY  30 tablet  0    Review of Systems:  As per HPI- otherwise negative.   Physical Examination: Filed Vitals:   09/11/12 0753  BP: 144/98  Pulse: 62  Temp: 97.9 F (36.6 C)  Resp: 16   Filed Vitals:   09/11/12 0753  Height: 5\' 11"  (1.803 m)  Weight: 222 lb (100.699 kg)   Body mass index is 30.96 kg/(m^2). Ideal Body Weight: Weight in (lb) to have BMI = 25: 178.9   GEN: WDWN, NAD,  Non-toxic, A & O x 3, overweight HEENT: Atraumatic, Normocephalic. Neck supple. No masses, No LAD.   Ears and Nose: No external deformity. CV: RRR, No M/G/R. No JVD. No thrill. No extra heart sounds. No able to reproduce pain with ROM of left shoulder or with pressing on chest.  PULM: CTA B, no wheezes, crackles, rhonchi. No retractions. No resp. distress. No accessory muscle use. ABD: S, NT, ND. No rebound. No HSM. EXTR: No c/c/e.  There is a small nodule over the extensor tendon of the left index finger at the MCP joint.  It is non- tender NEURO Normal gait.  PSYCH: Normally interactive. Conversant. Not depressed or anxious appearing.  Calm demeanor.   EKG: compared with EKG from 2012.  He has a partial BBB or fascicular block, no significant change from past and not acute ST elevation or depression.    Results for orders placed in visit on 09/11/12  POCT CBC       Component Value Range   WBC 6.1  4.6 - 10.2 K/uL   Lymph, poc 2.6  0.6 - 3.4   POC LYMPH PERCENT 43.2  10 - 50 %L   MID (cbc) 0.4  0 - 0.9   POC MID % 6.5  0 - 12 %M   POC Granulocyte 3.1  2 - 6.9   Granulocyte percent 50.3  37 - 80 %G   RBC 5.46  4.69 - 6.13 M/uL   Hemoglobin 16.6  14.1 - 18.1 g/dL   HCT, POC 82.9  56.2 - 53.7 %   MCV 92.9  80 - 97 fL   MCH, POC 30.4  27 - 31.2 pg   MCHC 32.7  31.8 - 35.4 g/dL   RDW, POC 13.0     Platelet Count, POC 221  142 - 424 K/uL   MPV 9.9  0 - 99.8 fL   Assessment and Plan: 1. Hypertension  EKG 12-Lead, POCT CBC, Comprehensive metabolic panel, Lipid panel, losartan (COZAAR) 100 MG tablet  2. Chest pain  Ambulatory referral to Cardiology   Carlos Whitney is here today to have his BP medication refilled.  However, he also mentioned that he has noted chest discomfort for the last several weeks.  It is not associated with exercise, and he has no radiation to the left shoulder or jaw, no sweating or nausea.  He has no pain at this time. His CP is atypical but does need further evaluation.  Will refer to cardiology for evaluation and probably a stress test.  In the meantime he is to take an aspirin a day and report any changes in the pattern of his CP, especially if he has pain that does not resolve.  Will also evaluate for high cholesterol.  He may also try taking tums when he notes the pain, as it may be GERD related  COPLAND,JESSICA, MD

## 2012-09-11 NOTE — Patient Instructions (Addendum)
Start taking a baby aspirin (81mg ) daily.  Check your BP about 3x a week at home and write it down.  Call or email me with an update in a couple of week.  We will be in touch regarding your cardiology referral. If you have any change in the pattern of your chest pain, or notice any pain in relation to exercise or activity please let me know  I will be in touch with the rest of your labs as well

## 2012-09-12 ENCOUNTER — Encounter: Payer: Self-pay | Admitting: Family Medicine

## 2012-09-13 MED ORDER — PRAVASTATIN SODIUM 40 MG PO TABS: 40.0000 mg | ORAL_TABLET | Freq: Every day | ORAL | Status: DC

## 2012-09-13 NOTE — Progress Notes (Signed)
Called and discussed his labs with him. His HDL is low, and triglyceriedes are high.  As he has HTN and overweight he is interested in trying a statin at least until he can get in better shape.  I will call in a low dose of pravachol for him, and we can recheck in a few months.  Also minimal elevation of ALT- recheck in a few months as well

## 2012-09-13 NOTE — Addendum Note (Signed)
Addended by: Abbe Amsterdam C on: 09/13/2012 11:53 AM   Modules accepted: Orders

## 2012-09-17 ENCOUNTER — Ambulatory Visit: Payer: BC Managed Care – PPO | Admitting: Internal Medicine

## 2012-09-19 ENCOUNTER — Ambulatory Visit (INDEPENDENT_AMBULATORY_CARE_PROVIDER_SITE_OTHER): Payer: Self-pay | Admitting: Cardiovascular Disease

## 2012-09-19 ENCOUNTER — Encounter: Payer: Self-pay | Admitting: Cardiovascular Disease

## 2012-09-19 VITALS — BP 140/100 | HR 66 | Ht 72.0 in | Wt 219.0 lb

## 2012-09-19 DIAGNOSIS — R079 Chest pain, unspecified: Secondary | ICD-10-CM

## 2012-09-19 NOTE — Progress Notes (Signed)
HPI:   37 year old gentleman presenting for initial cardiac evaluation. He was seen by Dr Patsy Lager and complained of shoulder and arm discomfort. There was concern for a cardiac etiology and he was referred here for further evaluation.  He's developed a vague discomfort in the left shoulder area as well as the left elbow and arm. His pain symptoms have not been consistently related to exertion. He has a difficult time characterizing the pain. There are no associated symptoms. He's not been very active over the last several months. He notes that he was eating a very poor diet, and since he has started drinking more water and changed his diet his symptoms have improved. He's had no exertional dyspnea, palpitations, or lightheadedness. He denies syncope, edema, orthopnea, or PND.  His past medical history is fairly unremarkable. He's been diagnosed with high blood pressure and takes losartan. He has recently started a statin drug. There is no family history of coronary artery disease. The patient has no personal history of tobacco use.  Outpatient Encounter Prescriptions as of 09/19/2012  Medication Sig Dispense Refill  . aspirin 81 MG tablet Take 81 mg by mouth daily.      Marland Kitchen losartan (COZAAR) 100 MG tablet Take 1 tablet (100 mg total) by mouth daily.  90 tablet  3  . pravastatin (PRAVACHOL) 40 MG tablet Take 1 tablet (40 mg total) by mouth daily.  30 tablet  6    Codeine  Past Medical History  Diagnosis Date  . ALLERGIC RHINITIS 06/04/2009  . ELEVATED BLOOD PRESSURE WITHOUT DIAGNOSIS OF HYPERTENSION 06/02/2009  . INSOMNIA-SLEEP DISORDER-UNSPEC 03/29/2010  . HTN (hypertension) 06/29/2011  . Anxiety     Past Surgical History  Procedure Date  . Partial index finguer amputation     work accident    History   Social History  . Marital Status: Married    Spouse Name: N/A    Number of Children: N/A  . Years of Education: N/A   Occupational History  . Not on file.   Social History Main  Topics  . Smoking status: Never Smoker   . Smokeless tobacco: Not on file     Comment: married x 2 (2011)  . Alcohol Use: No  . Drug Use: No  . Sexually Active: Not on file   Other Topics Concern  . Not on file   Social History Narrative  . No narrative on file    No family history on file.  ROS:  General: no fevers/chills/night sweats Eyes: no blurry vision, diplopia, or amaurosis ENT: no sore throat or hearing loss Resp: no cough, wheezing, or hemoptysis CV: no edema or palpitations GI: no abdominal pain, nausea, vomiting, diarrhea, or constipation. Positive for gastroesophageal reflux GU: no dysuria, frequency, or hematuria Skin: no rash Neuro: no headache, numbness, tingling, or weakness of extremities Musculoskeletal: no joint pain or swelling Heme: no bleeding, DVT, or easy bruising Endo: no polydipsia or polyuria  BP 140/100  Pulse 66  Ht 6' (1.829 m)  Wt 99.338 kg (219 lb)  BMI 29.70 kg/m2  PHYSICAL EXAM: Pt is alert and oriented, WD, WN, in no distress. HEENT: normal Neck: JVP normal. Carotid upstrokes normal without bruits. No thyromegaly. Lungs: equal expansion, clear bilaterally CV: Apex is discrete and nondisplaced, RRR without murmur or gallop Abd: soft, NT, +BS, no bruit, no hepatosplenomegaly Back: no CVA tenderness Ext: no C/C/E        Femoral pulses 2+= without bruits  DP/PT pulses intact and = Skin: warm and dry without rash Neuro: CNII-XII intact             Strength intact = bilaterally  EKG:   Normal sinus rhythm 67 beats per minute, within normal limits.  ASSESSMENT AND PLAN: Atypical chest pain: Overall the patient's cardiac risk is low considering his age of 37 years old, no significant family history of CAD, and no history of tobacco use. His cardiovascular risk factors include essential hypertension. He takes a statin drug but his LDL off treatment is 93.  I have recommended that we proceed with an exercise treadmill study  (nonimaging). Otherwise he will continue on his current medical program. I plan on seeing him back on an as-needed basis unless problems arise. Note his lipids and blood pressure are followed by his primary care physician. I asked him to resume checking his blood pressure at home to make sure that his therapy is adequate.  Tonny Bollman 09/19/2012 3:18 PM

## 2012-09-19 NOTE — Patient Instructions (Addendum)
Your physician recommends that you schedule a follow-up appointment as needed with Dr.Cooper  Your physician has requested that you have an exercise tolerance test with PA or NP. For further information please visit [email or web address] Please also follow instruction sheet, as given.

## 2012-10-02 ENCOUNTER — Encounter: Payer: Self-pay | Admitting: Nurse Practitioner

## 2012-10-02 ENCOUNTER — Ambulatory Visit (INDEPENDENT_AMBULATORY_CARE_PROVIDER_SITE_OTHER): Payer: Self-pay | Admitting: Nurse Practitioner

## 2012-10-02 VITALS — BP 143/98 | HR 80

## 2012-10-02 DIAGNOSIS — R079 Chest pain, unspecified: Secondary | ICD-10-CM

## 2012-10-02 NOTE — Progress Notes (Signed)
Exercise Treadmill Test  Pre-Exercise Testing Evaluation Rhythm: normal sinus  Rate: 64                 Test  Exercise Tolerance Test Ordering MD: Tonny Bollman, MD  Interpreting MD: Norma Fredrickson, NP  Unique Test No: 1   Treadmill:  1  Indication for ETT: chest pain - rule out ischemia  Contraindication to ETT: No   Stress Modality: exercise - treadmill  Cardiac Imaging Performed: non   Protocol: standard Bruce - maximal  Max BP:  217/67  Max MPHR (bpm):  185 85% MPR (bpm):  157  MPHR obtained (bpm):  169 % MPHR obtained:  91%  Reached 85% MPHR (min:sec):  7:53 Total Exercise Time (min-sec):  9:00  Workload in METS:  10.1 Borg Scale: 17  Reason ETT Terminated:  desired heart rate attained    ST Segment Analysis At Rest: normal ST segments - no evidence of significant ST depression With Exercise: no evidence of significant ST depression  Other Information Arrhythmia:  No Angina during ETT:  absent (0) Quality of ETT:  diagnostic  ETT Interpretation:  normal - no evidence of ischemia by ST analysis  Comments: Patient presents today for routine GXT. Has had some atypical chest pain. Has HTN and HLD. No early family history of CAD and no smoking history. BP has been fairly well controlled at home per his report.   Today he exercised on the standard Bruce protocol for a total of 9 minutes. He has good exercise tolerance. Mildly hypertensive blood pressure response. Clinically negative. Resting EKG with poor R wave progression. No ischemia noted. Tracings were reviewed with Dr. Excell Seltzer.   Recommendations: CV risk factor modification.   See back prn.   Patient is agreeable to this plan and will call if any problems develop in the interim.

## 2014-02-09 ENCOUNTER — Ambulatory Visit (INDEPENDENT_AMBULATORY_CARE_PROVIDER_SITE_OTHER): Payer: BC Managed Care – PPO | Admitting: Family Medicine

## 2014-02-09 VITALS — BP 138/100 | HR 87 | Temp 97.8°F | Ht 71.25 in | Wt 260.8 lb

## 2014-02-09 DIAGNOSIS — I10 Essential (primary) hypertension: Secondary | ICD-10-CM

## 2014-02-09 DIAGNOSIS — J329 Chronic sinusitis, unspecified: Secondary | ICD-10-CM

## 2014-02-09 MED ORDER — AMOXICILLIN-POT CLAVULANATE 875-125 MG PO TABS: 1.0000 | ORAL_TABLET | Freq: Two times a day (BID) | ORAL | Status: DC

## 2014-02-09 MED ORDER — LOSARTAN POTASSIUM 100 MG PO TABS
100.0000 mg | ORAL_TABLET | Freq: Every day | ORAL | Status: DC
Start: 2014-02-09 — End: 2016-01-18

## 2014-02-09 NOTE — Progress Notes (Signed)
° °  Subjective:    Patient ID: Carlos Whitney, male    DOB: [birth year], 38 y.o.   MRN: [id number]  HPI Chief Complaint  Patient presents with   sinus infection sx    x 1 day   This chart was scribed for Carlos Sidle, MD by Andrew Au, ED Scribe. This patient was seen in room 4 and the patient's care was started at 2:27 PM.  HPI Comments:  Tell Carlos Whitney is a 38 y.o. male who presents to the [facility] complaining of a possible worsening sinus infection onset 1 day ago. Pt reports he has year around allergies. Pt has been outside. Pt has been taking zyrtec and mucinex in the morning which helps with rhinorrhea and congestion. Pt now reports sinus pressure, fatigue, post nasal drip, mild fever last night, teeth pain. Pt has used flonase in the past. Pt denies chest congestion.     Past Medical History  Diagnosis Date   ALLERGIC RHINITIS 06/04/2009   ELEVATED BLOOD PRESSURE WITHOUT DIAGNOSIS OF HYPERTENSION 06/02/2009   INSOMNIA-SLEEP DISORDER-UNSPEC 03/29/2010   HTN (hypertension) 06/29/2011   Anxiety    HLD (hyperlipidemia)     on statin therapy   Allergies  Allergen Reactions   Codeine    Prior to Admission medications   Medication Sig Start Date End Date Taking? Authorizing Provider  aspirin 81 MG tablet Take 81 mg by mouth daily.   Yes Historical Provider, MD   Review of Systems  Constitutional: Positive for fever and fatigue.  HENT: Positive for congestion, postnasal drip, rhinorrhea and sinus pressure.   Allergic/Immunologic: Positive for environmental allergies.   Objective:   Physical Exam  Nursing note and vitals reviewed. Constitutional: He is oriented to person, place, and time. He appears well-developed and well-nourished. No distress.  HENT:  Head: Normocephalic and atraumatic.  Eyes: EOM are normal.  Neck: Neck supple. No tracheal deviation present.  Cardiovascular: Normal rate.   BP -130/90  Pulmonary/Chest: Effort normal.  No respiratory distress.  Musculoskeletal: Normal range of motion.  Neurological: He is alert and oriented to person, place, and time.  Skin: Skin is warm and dry.  Psychiatric: He has a normal mood and affect. His behavior is normal.   narrowed nasal passages with mucopurulent discharge bilaterally Assessment & Plan:   1. Sinusitis   2. Hypertension      Meds ordered this encounter  Medications   losartan (COZAAR) 100 MG tablet    Sig: Take 1 tablet (100 mg total) by mouth daily.    Dispense:  90 tablet    Refill:  3    Patient needs to schedule office visit   amoxicillin-clavulanate (AUGMENTIN) 875-125 MG per tablet    Sig: Take 1 tablet by mouth 2 (two) times daily.    Dispense:  20 tablet    Refill:  0    Carlos Sidle, MD

## 2016-01-18 ENCOUNTER — Encounter (HOSPITAL_COMMUNITY): Payer: Self-pay | Admitting: Nurse Practitioner

## 2016-01-18 ENCOUNTER — Ambulatory Visit (HOSPITAL_COMMUNITY)
Admission: EM | Admit: 2016-01-18 | Discharge: 2016-01-18 | Disposition: A | Discharge status: Home / Self Care | Payer: Managed Care, Other (non HMO) | Attending: Emergency Medicine | Admitting: Emergency Medicine

## 2016-01-18 DIAGNOSIS — J069 Acute upper respiratory infection, unspecified: Secondary | ICD-10-CM | POA: Diagnosis not present

## 2016-01-18 DIAGNOSIS — B9789 Other viral agents as the cause of diseases classified elsewhere: Principal | ICD-10-CM

## 2016-01-18 MED ORDER — PREDNISONE 50 MG PO TABS: ORAL_TABLET | ORAL | Status: DC

## 2016-01-18 MED ORDER — LOSARTAN POTASSIUM 50 MG PO TABS: 50.0000 mg | ORAL_TABLET | Freq: Every day | ORAL | Status: DC

## 2016-01-18 MED ORDER — AZITHROMYCIN 250 MG PO TABS: ORAL_TABLET | ORAL | Status: DC

## 2016-01-18 NOTE — ED Notes (Signed)
Pt c/o 2 day history of productive cough, sore throat, fevers. He has tried cough medication and mucinex at home but continues to feel worse. He is alert and breathing easily

## 2016-01-18 NOTE — ED Provider Notes (Signed)
CSN: [id number]     Arrival date & time 01/18/16  1908 History   First MD Initiated Contact with Patient 01/18/16 2057     Chief Complaint  Patient presents with  . URI   (Consider location/radiation/quality/duration/timing/severity/associated sxs/prior Treatment) HPI He is a 40 year old man here for evaluation of URI symptoms. His symptoms started 2 days ago with postnasal drainage and a sore throat. He started cetirizine and Mucinex and things got a little bit better, but worsened that evening.  He reports nasal congestion, sore throat, and cough. He states the cough is very harsh. It is mostly dry, but occasionally productive of yellow sputum. No wheezing. He will get short of breath with coughing spasms. He has had some low-grade fevers between 99 and 100. No nausea or vomiting. 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  He will also like a refill of his blood pressure medication.  He states he's been on losartan for several years for borderline hypertension. He ran out of this about a month ago, and has been able to schedule something with his PCP. He denies any symptoms.  Past Medical History  Diagnosis Date  . ALLERGIC RHINITIS 06/04/2009  . ELEVATED BLOOD PRESSURE WITHOUT DIAGNOSIS OF HYPERTENSION 06/02/2009  . INSOMNIA-SLEEP DISORDER-UNSPEC 03/29/2010  . HTN (hypertension) 06/29/2011  . Anxiety   . HLD (hyperlipidemia)     on statin therapy   Past Surgical History  Procedure Laterality Date  . Partial index finguer amputation      work accident   History reviewed. No pertinent family history. Social History  Substance Use Topics  . Smoking status: Never Smoker   . Smokeless tobacco: None     Comment: married x 2 (2011)  . Alcohol Use: No    Review of Systems As in history of present illness Allergies  Codeine  Home Medications   Prior to Admission medications   Medication Sig Start Date End Date Taking? Authorizing Provider  azithromycin (ZITHROMAX Z-PAK)  250 MG tablet Take 2 pills today, then 1 pill daily until gone. 01/18/16   Charm RingsErin J Maximilien Hayashi, MD  losartan (COZAAR) 50 MG tablet Take 1 tablet (50 mg total) by mouth daily. 01/18/16   Charm RingsErin J Rahcel Shutes, MD  predniSONE (DELTASONE) 50 MG tablet Take 1 pill daily for 5 days. 01/18/16   Charm RingsErin J Thayden Lemire, MD   Meds Ordered and Administered this Visit  Medications - No data to display  BP 134/90 mmHg  Pulse 60  Temp(Src) 99.2 F (37.3 C) (Oral)  Resp 16  SpO2 99% No data found.   Physical Exam  Constitutional: He is oriented to person, place, and time. He appears well-developed and well-nourished. No distress.  HENT:  Mouth/Throat: No oropharyngeal exudate.  Oropharynx is erythematous with postnasal drainage. Nasal mucosa is mildly erythematous and edematous.  Neck: Neck supple.  Cardiovascular: Normal rate and regular rhythm.   No murmur heard. Pulmonary/Chest: Effort normal and breath sounds normal. No respiratory distress. He has no wheezes. He has no rales.  Lymphadenopathy:    He has no cervical adenopathy.  Neurological: He is alert and oriented to person, place, and time.    ED Course  Procedures (including critical care time)  Labs Review Labs Reviewed - No data to display  Imaging Review No results found.   MDM   1. Viral URI with cough    No sign of pneumonia, bronchitis, or bacterial sinusitis. Symptomatic treatment with OTC medications as well as prednisone. Prescription given for azithromycin to  fill if not improving in 2 days. I did provide a 30 day supply of losartan. I cut back his dose to 50 mg. He will follow-up with his PCP in the next several weeks.    Charm Rings, MD 01/18/16 2127

## 2016-01-18 NOTE — Discharge Instructions (Signed)
You have an upper respiratory virus. There is no sign of sinus infection, bronchitis, or pneumonia at this time. Please continue the cetirizine and Mucinex. Take the prednisone as prescribed. If your symptoms are not improving in 2 days or you develop fevers greater than 101, fill the prescription for azithromycin.  I provided a 30 day supply of the losartan. According to the computer your dose was 100 mg tablet daily. I have cut this in half to 50 mg daily. Please make an appointment to see your primary care doctor to follow up your blood pressure in the next 2 weeks.

## 2020-06-18 ENCOUNTER — Encounter (HOSPITAL_COMMUNITY): Payer: Self-pay

## 2020-06-18 ENCOUNTER — Emergency Department (HOSPITAL_COMMUNITY)
Admission: EM | Admit: 2020-06-18 | Discharge: 2020-06-18 | Disposition: A | Discharge status: Home / Self Care | Payer: BC Managed Care – PPO | Attending: Emergency Medicine | Admitting: Emergency Medicine

## 2020-06-18 ENCOUNTER — Other Ambulatory Visit: Payer: Self-pay

## 2020-06-18 ENCOUNTER — Emergency Department (HOSPITAL_COMMUNITY): Payer: BC Managed Care – PPO

## 2020-06-18 DIAGNOSIS — Z79899 Other long term (current) drug therapy: Secondary | ICD-10-CM | POA: Diagnosis not present

## 2020-06-18 DIAGNOSIS — I10 Essential (primary) hypertension: Secondary | ICD-10-CM | POA: Insufficient documentation

## 2020-06-18 DIAGNOSIS — R079 Chest pain, unspecified: Secondary | ICD-10-CM | POA: Diagnosis present

## 2020-06-18 LAB — BASIC METABOLIC PANEL
Anion gap: 11 (ref 5–15)
BUN: 11 mg/dL (ref 6–20)
CO2: 24 mmol/L (ref 22–32)
Calcium: 9.3 mg/dL (ref 8.9–10.3)
Chloride: 106 mmol/L (ref 98–111)
Creatinine, Ser: 1.21 mg/dL (ref 0.61–1.24)
GFR calc non Af Amer: >60 mL/min (ref >60)
Glucose, Bld: 84 mg/dL (ref 70–99)
Potassium: 4.1 mmol/L (ref 3.5–5.1)
Sodium: 141 mmol/L (ref 135–145)

## 2020-06-18 LAB — CBC
HCT: 49.3 % (ref 39.0–52.0)
Hemoglobin: 16.9 g/dL (ref 13.0–17.0)
MCH: 30.6 pg (ref 26.0–34.0)
MCHC: 34.3 g/dL (ref 30.0–36.0)
MCV: 89.3 fL (ref 80.0–100.0)
Platelets: 216 10*3/uL (ref 150–400)
RBC: 5.52 MIL/uL (ref 4.22–5.81)
RDW: 12 % (ref 11.5–15.5)
WBC: 6.2 10*3/uL (ref 4.0–10.5)
nRBC: 0 % (ref 0.0–0.2)

## 2020-06-18 LAB — TROPONIN I (HIGH SENSITIVITY)
Troponin I (High Sensitivity): 3 ng/L (ref <18)
Troponin I (High Sensitivity): 3 ng/L (ref <18)

## 2020-06-18 NOTE — ED Notes (Signed)
Esignature pad not available. Pt agreeable to discharge.

## 2020-06-18 NOTE — ED Triage Notes (Signed)
Pt reports that since Sunday he has been having L sided CP, radiation to L arm and neck, some SOB

## 2020-06-18 NOTE — Discharge Instructions (Addendum)
Please call cardiology for outpatient follow-up Return if you have worsening chest pain, pain that returns and does not resolve, or new symptoms with your chest pain. Avoid strenuous activity until reevaluated Please follow-up with your primary care doctor for blood pressure recheck and lipid check.  All risk factors for heart disease should be modified, this includes high blood pressure and high cholesterol

## 2020-06-18 NOTE — ED Provider Notes (Signed)
[facility] EMERGENCY DEPARTMENT Provider Note   CSN: [id number] Arrival date & time: 06/18/20  4132     History Chief Complaint  Patient presents with  . Chest Pain    Carlos Whitney is a 44 y.o. male.  HPI  HPI: A 44 year old patient with a history of hypertension and obesity presents for evaluation of chest pain. Initial onset of pain was more than 6 hours ago. The patient's chest pain is described as heaviness/pressure/tightness, is sharp and is not worse with exertion. The patient's chest pain is middle- or left-sided, is not well-localized and does radiate to the arms/jaw/neck. The patient does not complain of nausea and denies diaphoresis. The patient has no history of stroke, has no history of peripheral artery disease, has not smoked in the past 90 days, denies any history of treated diabetes, has no relevant family history of coronary artery disease (first degree relative at less than age 70) and has no history of hypercholesterolemia.   Past Medical History:  Diagnosis Date  . ALLERGIC RHINITIS 06/04/2009  . Anxiety   . ELEVATED BLOOD PRESSURE WITHOUT DIAGNOSIS OF HYPERTENSION 06/02/2009  . HLD (hyperlipidemia)    on statin therapy  . HTN (hypertension) 06/29/2011  . INSOMNIA-SLEEP DISORDER-UNSPEC 03/29/2010    Patient Active Problem List   Diagnosis Date Noted  . HTN (hypertension) 06/29/2011  . Preventative health care 06/27/2011  . INSOMNIA-SLEEP DISORDER-UNSPEC 03/29/2010  . ALLERGIC RHINITIS 06/04/2009    Past Surgical History:  Procedure Laterality Date  . partial index finguer amputation     work accident       History reviewed. No pertinent family history.  Social History   Tobacco Use  . Smoking status: Never Smoker  . Smokeless tobacco: Never Used  . Tobacco comment: married x 2 (2011)  Substance Use Topics  . Alcohol use: No  . Drug use: No    Home Medications Prior to Admission medications   Medication Sig Start  Date End Date Taking? Authorizing Provider  azithromycin (ZITHROMAX Z-PAK) 250 MG tablet Take 2 pills today, then 1 pill daily until gone. 01/18/16   Charm Rings, MD  losartan (COZAAR) 50 MG tablet Take 1 tablet (50 mg total) by mouth daily. 01/18/16   Charm Rings, MD  predniSONE (DELTASONE) 50 MG tablet Take 1 pill daily for 5 days. 01/18/16   Charm Rings, MD    Allergies    Codeine  Review of Systems   Review of Systems  All other systems reviewed and are negative.   Physical Exam Updated Vital Signs BP (!) 153/107   Pulse 62   Temp (!) 97.5 F (36.4 C) (Oral)   Resp 18   SpO2 99%   Physical Exam Vitals and nursing note reviewed.  Constitutional:      Appearance: He is well-developed.  HENT:     Head: Normocephalic and atraumatic.     Right Ear: External ear normal.     Left Ear: External ear normal.     Nose: Nose normal.  Eyes:     Conjunctiva/sclera: Conjunctivae normal.     Pupils: Pupils are equal, round, and reactive to light.  Cardiovascular:     Rate and Rhythm: Normal rate and regular rhythm.     Heart sounds: Normal heart sounds.  Pulmonary:     Effort: Pulmonary effort is normal.     Breath sounds: Normal breath sounds.  Abdominal:     General: Bowel sounds are normal.  Palpations: Abdomen is soft.  Musculoskeletal:        General: Normal range of motion.     Cervical back: Normal range of motion and neck supple.  Skin:    General: Skin is warm and dry.  Neurological:     Mental Status: He is alert and oriented to person, place, and time.     Deep Tendon Reflexes: Reflexes are normal and symmetric.  Psychiatric:        Behavior: Behavior normal.        Thought Content: Thought content normal.        Judgment: Judgment normal.     ED Results / Procedures / Treatments   Labs (all labs ordered are listed, but only abnormal results are displayed) Labs Reviewed  BASIC METABOLIC PANEL  CBC  TROPONIN I (HIGH SENSITIVITY)    EKG EKG  Interpretation  Date/Time:  Thursday June 18 2020 06:28:40 EDT Ventricular Rate:  70 PR Interval:  150 QRS Duration: 90 QT Interval:  400 QTC Calculation: 432 R Axis:   -37 Text Interpretation: Normal sinus rhythm Left axis deviation Possible Lateral infarct , age undetermined Abnormal ECG No old tracing to compare Confirmed by Margarita Grizzle [phone]) on 06/18/2020 7:35:31 AM   Radiology DG Chest 2 View  Result Date: 06/18/2020 CLINICAL DATA:  44 year old male with history of left-sided chest pain with radiation to the left arm and neck. Some associated shortness of breath. EXAM: CHEST - 2 VIEW COMPARISON:  Chest x-Carlos Whitney 01/10/2011. FINDINGS: Lung volumes are normal. No consolidative airspace disease. No pleural effusions. No pneumothorax. No pulmonary nodule or mass noted. Pulmonary vasculature and the cardiomediastinal silhouette are within normal limits. IMPRESSION: No radiographic evidence of acute cardiopulmonary disease. Electronically Signed   By: Trudie Reed M.D.   On: 06/18/2020 06:46    Procedures Procedures (including critical care time)  Medications Ordered in ED Medications - No data to display  ED Course  I have reviewed the triage vital signs and the nursing notes.  Pertinent labs & imaging results that were available during my care of the patient were reviewed by me and considered in my medical decision making (see chart for details).  Clinical Course as of Jun 19 1007  Thu Jun 18, 2020  1003 Reviewed cxr and interpretation. Personally reviewed images and radiology report   [DR]    Clinical Course User Index [DR] Margarita Grizzle, MD   MDM Rules/Calculators/A&P HEAR Score: 3                        44 year old male presents today with chest pain intermittently last since last Sunday.  It is sharp in nature and does not worsened with exertion.  In fact, patient feels it is better during the day when he is exerting himself notices it in the evening.  There was  some reproducibility to it and he did have an injury couple weeks before it began.  However the injury does not seem to have caused the pain.  Evaluation here has a heart score of 3.  Troponin and repeat troponin are normal.  Patient is discharged in stable condition but with strict return precautions and advised regarding the need for risk factor modification. Final Clinical Impression(s) / ED Diagnoses Final diagnoses:  Chest pain, unspecified type    Rx / DC Orders ED Discharge Orders    None       Margarita Grizzle, MD 06/18/20 1008

## 2020-07-03 ENCOUNTER — Ambulatory Visit: Payer: BC Managed Care – PPO | Admitting: Internal Medicine

## 2020-07-03 ENCOUNTER — Encounter: Payer: Self-pay | Admitting: Internal Medicine

## 2020-07-03 ENCOUNTER — Encounter: Payer: Self-pay | Admitting: *Deleted

## 2020-07-03 ENCOUNTER — Other Ambulatory Visit: Payer: Self-pay

## 2020-07-03 VITALS — BP 128/84 | HR 56 | Ht 71.0 in | Wt 225.0 lb

## 2020-07-03 DIAGNOSIS — R079 Chest pain, unspecified: Secondary | ICD-10-CM | POA: Diagnosis not present

## 2020-07-03 DIAGNOSIS — R072 Precordial pain: Secondary | ICD-10-CM | POA: Diagnosis not present

## 2020-07-03 DIAGNOSIS — I10 Essential (primary) hypertension: Secondary | ICD-10-CM

## 2020-07-03 DIAGNOSIS — E7849 Other hyperlipidemia: Secondary | ICD-10-CM | POA: Diagnosis not present

## 2020-07-03 NOTE — Patient Instructions (Signed)
Medication Instructions:  No changes *If you need a refill on your cardiac medications before your next appointment, please call your pharmacy*   Lab Work: None today If you have labs (blood work) drawn today and your tests are completely normal, you will receive your results only by: Marland Kitchen MyChart Message (if you have MyChart) OR . A paper copy in the mail If you have any lab test that is abnormal or we need to change your treatment, we will call you to review the results.   Testing/Procedures: Your physician has requested that you have a lexiscan myoview. For further information please visit [email or web address] Please follow instruction sheet, as given.   Follow-Up: At [facility], you and your health needs are our priority.  As part of our continuing mission to provide you with exceptional heart care, we have created designated Provider Care Teams.  These Care Teams include your primary Cardiologist (physician) and Advanced Practice Providers (APPs -  Physician Assistants and Nurse Practitioners) who all work together to provide you with the care you need, when you need it.  We recommend signing up for the patient portal called "MyChart".  Sign up information is provided on this After Visit Summary.  MyChart is used to connect with patients for Virtual Visits (Telemedicine).  Patients are able to view lab/test results, encounter notes, upcoming appointments, etc.  Non-urgent messages can be sent to your provider as well.   To learn more about what you can do with MyChart, go to [email or web address].    Your next appointment:   6 month(s)  The format for your next appointment:   In Person  Provider:   Riley Lam, MD   Other Instructions

## 2020-07-03 NOTE — Progress Notes (Signed)
Cardiology Office Note:    Date:  07/03/2020   ID:  Carlos Whitney, DOB 24-Nov-1975, MRN [id number]  PCP:  Patient, No Pcp Per  [facility] Cardiologist:  Christell Constant, MD  Distantly seen by Dr. Excell Seltzer 2014  Referring MD: Margarita Grizzle, MD   CC: Chest Pain Consulted for the evaluation of chest pain at the behest of Dr. Margarita Grizzle  History of Present Illness:    Carlos Whitney is a 44 y.o. male with a hx of HTN and HLD on statin therapy who presents with chest pain. History of negative ECG Stress test 2014 after seeing Dr. Excell Seltzer.  Patient notes that he had chest pain into collar bone that radiated to shoulder 06/14/20.  Could not sleep because of chest tightness.  This pain resolved subsided overtime without intervention (fell asleep).  Day after noted left arm weakness and felt his arm was going asleep.  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  Day after had shooting pain along his collarbone without chest tightness.  Chest pain persistent into 06/19/20 and got evaluation.  Has not had chest pain since.  No pain with exertion after this.  Places softball every weekend without issues.  No issues with sprinting to bases.  No SOB, DOE, No syncope.  Ambulatory Blood pressure during this episodes 150/128 during this event 06/19/20.  Past Medical History:  Diagnosis Date  . ALLERGIC RHINITIS 06/04/2009  . Anxiety   . ELEVATED BLOOD PRESSURE WITHOUT DIAGNOSIS OF HYPERTENSION 06/02/2009  . HLD (hyperlipidemia)    on statin therapy  . HTN (hypertension) 06/29/2011  . INSOMNIA-SLEEP DISORDER-UNSPEC 03/29/2010    Past Surgical History:  Procedure Laterality Date  . partial index finguer amputation     work accident   Current Medications: Current Meds  Medication Sig  . aspirin EC 81 MG tablet Take 243 mg by mouth daily. Swallow whole.  . cetirizine (ZYRTEC) 10 MG tablet Take 10 mg by mouth daily.    Allergies:   Codeine   Social History    Socioeconomic History  . Marital status: Married    Spouse name: Not on file  . Number of children: Not on file  . Years of education: Not on file  . Highest education level: Not on file  Occupational History  . Not on file  Tobacco Use  . Smoking status: Never Smoker  . Smokeless tobacco: Never Used  . Tobacco comment: married x 2 (2011)  Substance and Sexual Activity  . Alcohol use: No  . Drug use: No  . Sexual activity: Yes  Other Topics Concern  . Not on file  Social History Narrative  . Not on file   Social Determinants of Health   Financial Resource Strain:   . Difficulty of Paying Living Expenses: Not on file  Food Insecurity:   . Worried About Programme researcher, broadcasting/film/video in the Last Year: Not on file  . Ran Out of Food in the Last Year: Not on file  Transportation Needs:   . Lack of Transportation (Medical): Not on file  . Lack of Transportation (Non-Medical): Not on file  Physical Activity:   . Days of Exercise per Week: Not on file  . Minutes of Exercise per Session: Not on file  Stress:   . Feeling of Stress : Not on file  Social Connections:   . Frequency of Communication with Friends and Family: Not on file  . Frequency of Social Gatherings with Friends  and Family: Not on file  . Attends Religious Services: Not on file  . Active Member of Clubs or Organizations: Not on file  . Attends Banker Meetings: Not on file  . Marital Status: Not on file     Family History: The patient's family history includes Diabetes in his father; Hypertension in his mother. No family history of early CAD.  ROS:   Please see the history of present illness.    All other systems reviewed and are negative.  EKGs/Labs/Other Studies Reviewed:    The following studies were reviewed today:  EKG:   06/18/20:  Sinus rhythm 70 no ST Changes, borderline evidence for lateral q waves  Recent Labs: 06/18/2020: BUN 11; Creatinine, Ser 1.21; Hemoglobin 16.9; Platelets 216;  Potassium 4.1; Sodium 141  Recent Lipid Panel    Component Value Date/Time   CHOL 175 09/11/2012 0831   TRIG 226 (H) 09/11/2012 0831   HDL 37 (L) 09/11/2012 0831   CHOLHDL 4.7 09/11/2012 0831   VLDL 45 (H) 09/11/2012 0831   LDLCALC 93 09/11/2012 0831   LDLDIRECT 109.3 06/29/2011 1655    Physical Exam:    VS:  BP 128/84   Pulse (!) 56   Ht 5\' 11"  (1.803 m)   Wt 225 lb (102.1 kg)   SpO2 97%   BMI 31.38 kg/m     Wt Readings from Last 3 Encounters:  07/03/20 225 lb (102.1 kg)  02/09/14 260 lb 12.8 oz (118.3 kg)  09/19/12 219 lb (99.3 kg)     GEN: Well nourished, well developed in no acute distress HEENT: Normal NECK: No JVD; No carotid bruits LYMPHATICS: No lymphadenopathy CARDIAC: RRR, no murmurs, rubs, gallops RESPIRATORY:  Clear to auscultation without rales, wheezing or rhonchi  ABDOMEN: Soft, non-tender, non-distended MUSCULOSKELETAL:  No edema; No deformity  SKIN: Warm and dry NEUROLOGIC:  Alert and oriented x 3 PSYCHIATRIC:  Normal affect   ASSESSMENT:    1. Chest pain of uncertain etiology   2. Essential hypertension   3. Other hyperlipidemia   4. Precordial pain    PLAN:    In order of problems listed above:  Chest Pain with  Essential Hypertension and HLD ASCVD Obesity - The patient presents with atypical chest pain - Would recommend a vasodilator nuclear medicine stress test (NPO at midnight/hold beta blocker in AM); discussed risks, benefits, and alternatives of the diagnostic procedure including chest pain, arrhythmia, and death.  Patient amenable for testing. - in preparation for possible significant test result, risks and benefits of cardiac catheterization have been discussed with the patient.  These include bleeding, infection, kidney damage, stroke, heart attack, death.  The patient understands these risks and is willing to proceed if needed - will start ambulatory blood monitoring again - discussed diet (DASH/low sodium), and exercise/weight  loss interventions  - LDL goal less than 100  6 month one time follow up unless new symptoms or abnormal test results warranting change in plan  Would be reasonable for Virtual Follow up Would be reasonable for APP Follow up   Medication Adjustments/Labs and Tests Ordered: Current medicines are reviewed at length with the patient today.  Concerns regarding medicines are outlined above.  Orders Placed This Encounter  Procedures  . MYOCARDIAL PERFUSION IMAGING   No orders of the defined types were placed in this encounter.   Patient Instructions  Medication Instructions:  No changes *If you need a refill on your cardiac medications before your next appointment, please call your pharmacy*  Lab Work: None today If you have labs (blood work) drawn today and your tests are completely normal, you will receive your results only by: Marland Kitchen MyChart Message (if you have MyChart) OR . A paper copy in the mail If you have any lab test that is abnormal or we need to change your treatment, we will call you to review the results.   Testing/Procedures: Your physician has requested that you have a lexiscan myoview. For further information please visit [email or web address] Please follow instruction sheet, as given.   Follow-Up: At [facility], you and your health needs are our priority.  As part of our continuing mission to provide you with exceptional heart care, we have created designated Provider Care Teams.  These Care Teams include your primary Cardiologist (physician) and Advanced Practice Providers (APPs -  Physician Assistants and Nurse Practitioners) who all work together to provide you with the care you need, when you need it.  We recommend signing up for the patient portal called "MyChart".  Sign up information is provided on this After Visit Summary.  MyChart is used to connect with patients for Virtual Visits (Telemedicine).  Patients are able to view lab/test results, encounter  notes, upcoming appointments, etc.  Non-urgent messages can be sent to your provider as well.   To learn more about what you can do with MyChart, go to [email or web address].    Your next appointment:   6 month(s)  The format for your next appointment:   In Person  Provider:   Riley Lam, MD   Other Instructions      Signed, Christell Constant, MD  07/03/2020 9:35 AM    [facility]

## 2020-07-13 ENCOUNTER — Telehealth (HOSPITAL_COMMUNITY): Payer: Self-pay | Admitting: *Deleted

## 2020-07-13 ENCOUNTER — Encounter (HOSPITAL_COMMUNITY): Payer: Self-pay | Admitting: *Deleted

## 2020-07-13 NOTE — Telephone Encounter (Signed)
Letter sent via [employer] outlining instructions for upcoming stress test on 07/17/20 at 7:30.

## 2020-07-17 ENCOUNTER — Other Ambulatory Visit: Payer: Self-pay

## 2020-07-17 ENCOUNTER — Ambulatory Visit (HOSPITAL_COMMUNITY): Payer: BC Managed Care – PPO | Attending: Cardiology

## 2020-07-17 DIAGNOSIS — R072 Precordial pain: Secondary | ICD-10-CM | POA: Diagnosis present

## 2020-07-17 LAB — MYOCARDIAL PERFUSION IMAGING
LV dias vol: 98 mL (ref 62–150)
LV sys vol: 41 mL
Peak HR: 88 {beats}/min
Rest HR: 53 {beats}/min
SDS: 0
SRS: 0
SSS: 0
TID: 0.98

## 2020-07-17 MED ORDER — TECHNETIUM TC 99M TETROFOSMIN IV KIT
10.7000 | PACK | Freq: Once | INTRAVENOUS | Status: AC | PRN
  Administered 2020-07-17: 10.7 via INTRAVENOUS
  Filled 2020-07-17: qty 11

## 2020-07-17 MED ORDER — TECHNETIUM TC 99M TETROFOSMIN IV KIT
32.1000 | PACK | Freq: Once | INTRAVENOUS | Status: AC | PRN
  Administered 2020-07-17: 32.1 via INTRAVENOUS
  Filled 2020-07-17: qty 33

## 2020-07-17 MED ORDER — REGADENOSON 0.4 MG/5ML IV SOLN
0.4000 mg | Freq: Once | INTRAVENOUS | Status: AC
  Administered 2020-07-17: 0.4 mg via INTRAVENOUS

## 2021-04-12 ENCOUNTER — Other Ambulatory Visit: Payer: Self-pay

## 2021-04-12 ENCOUNTER — Emergency Department (HOSPITAL_BASED_OUTPATIENT_CLINIC_OR_DEPARTMENT_OTHER)
Admission: EM | Admit: 2021-04-12 | Discharge: 2021-04-12 | Disposition: A | Discharge status: Home / Self Care | Payer: BC Managed Care – PPO | Attending: Emergency Medicine | Admitting: Emergency Medicine

## 2021-04-12 ENCOUNTER — Encounter (HOSPITAL_BASED_OUTPATIENT_CLINIC_OR_DEPARTMENT_OTHER): Payer: Self-pay | Admitting: Obstetrics and Gynecology

## 2021-04-12 DIAGNOSIS — R112 Nausea with vomiting, unspecified: Secondary | ICD-10-CM

## 2021-04-12 DIAGNOSIS — I10 Essential (primary) hypertension: Secondary | ICD-10-CM | POA: Diagnosis not present

## 2021-04-12 DIAGNOSIS — R531 Weakness: Secondary | ICD-10-CM | POA: Insufficient documentation

## 2021-04-12 DIAGNOSIS — Z8616 Personal history of COVID-19: Secondary | ICD-10-CM | POA: Diagnosis not present

## 2021-04-12 DIAGNOSIS — R197 Diarrhea, unspecified: Secondary | ICD-10-CM

## 2021-04-12 DIAGNOSIS — R109 Unspecified abdominal pain: Secondary | ICD-10-CM | POA: Insufficient documentation

## 2021-04-12 DIAGNOSIS — Z7982 Long term (current) use of aspirin: Secondary | ICD-10-CM | POA: Diagnosis not present

## 2021-04-12 LAB — CBC
HCT: 48.6 % (ref 39.0–52.0)
Hemoglobin: 17 g/dL (ref 13.0–17.0)
MCH: 31 pg (ref 26.0–34.0)
MCHC: 35 g/dL (ref 30.0–36.0)
MCV: 88.5 fL (ref 80.0–100.0)
Platelets: 252 10*3/uL (ref 150–400)
RBC: 5.49 MIL/uL (ref 4.22–5.81)
RDW: 12.5 % (ref 11.5–15.5)
WBC: 12.1 10*3/uL — ABNORMAL HIGH (ref 4.0–10.5)
nRBC: 0 % (ref 0.0–0.2)

## 2021-04-12 LAB — BASIC METABOLIC PANEL
Anion gap: 10 (ref 5–15)
BUN: 17 mg/dL (ref 6–20)
CO2: 22 mmol/L (ref 22–32)
Calcium: 9.6 mg/dL (ref 8.9–10.3)
Chloride: 105 mmol/L (ref 98–111)
Creatinine, Ser: 1.05 mg/dL (ref 0.61–1.24)
GFR, Estimated: >60 mL/min (ref >60)
Glucose, Bld: 127 mg/dL — ABNORMAL HIGH (ref 70–99)
Potassium: 4.1 mmol/L (ref 3.5–5.1)
Sodium: 137 mmol/L (ref 135–145)

## 2021-04-12 LAB — URINALYSIS, ROUTINE W REFLEX MICROSCOPIC
Bilirubin Urine: NEGATIVE
Glucose, UA: NEGATIVE mg/dL
Hgb urine dipstick: NEGATIVE
Ketones, ur: 15 mg/dL — AB
Leukocytes,Ua: NEGATIVE
Nitrite: NEGATIVE
Protein, ur: NEGATIVE mg/dL
Specific Gravity, Urine: 1.019 (ref 1.005–1.030)
pH: 5.5 (ref 5.0–8.0)

## 2021-04-12 LAB — LIPASE, BLOOD: Lipase: 17 U/L (ref 11–51)

## 2021-04-12 MED ORDER — PREDNISONE 20 MG PO TABS: 40.0000 mg | ORAL_TABLET | Freq: Every day | ORAL | 0 refills | Status: DC

## 2021-04-12 MED ORDER — ONDANSETRON HCL 4 MG/2ML IJ SOLN
4.0000 mg | Freq: Once | INTRAMUSCULAR | Status: AC
  Administered 2021-04-12: 4 mg via INTRAVENOUS
  Filled 2021-04-12: qty 2

## 2021-04-12 MED ORDER — LACTATED RINGERS IV BOLUS
1000.0000 mL | Freq: Once | INTRAVENOUS | Status: AC
  Administered 2021-04-12: 1000 mL via INTRAVENOUS

## 2021-04-12 MED ORDER — ONDANSETRON HCL 4 MG/2ML IJ SOLN: 4.0000 mg | Freq: Once | INTRAMUSCULAR | Status: DC | PRN

## 2021-04-12 MED ORDER — ONDANSETRON HCL 4 MG PO TABS: 4.0000 mg | ORAL_TABLET | Freq: Four times a day (QID) | ORAL | 0 refills | Status: DC

## 2021-04-12 NOTE — ED Provider Notes (Signed)
[facility] EMERGENCY DEPT Provider Note   CSN: [id number] Arrival date & time: 04/12/21  2101     History Chief Complaint  Patient presents with   Dehydration   Diarrhea    Carlos Whitney is a 45 y.o. male.  Patient is a 45 year old male with a history of hypertension, hyperlipidemia who is presenting today with complaints of diarrhea that started earlier today and then vomiting that started once he arrived in the emergency room.  Patient reports this morning when he woke up he had a little bit of stomach discomfort and had an episode of diarrhea right after he got to work but then throughout the day he has had recurrent episodes of diarrhea.  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  It has been watery with some mild abdominal cramping.  When he got home he just felt very weak and drained.  He was trying to drink fluids but anytime he would drink he would have more diarrhea.  He took Imodium right before coming but then when he got here had more diarrhea and then started having multiple episodes of vomiting.  He feels weak with standing and his legs feel restless.  He does report on Saturday and Sunday he was outside and was concerned he may have gotten dehydrated but went home and did drink fluids and felt okay when he woke up this morning.  No known bad food exposure.  Nobody else at home sick.  He was positive for COVID 3 weeks ago but had fully recovered.  Denies any recent medication changes.  No drug or alcohol use.  No prior history of GI issues.  No antibiotics in the last month.  No blood in his stool.  The history is provided by the patient.  Diarrhea     Past Medical History:  Diagnosis Date   ALLERGIC RHINITIS 06/04/2009   Anxiety    ELEVATED BLOOD PRESSURE WITHOUT DIAGNOSIS OF HYPERTENSION 06/02/2009   HLD (hyperlipidemia)    on statin therapy   HTN (hypertension) 06/29/2011   INSOMNIA-SLEEP DISORDER-UNSPEC 03/29/2010    Patient Active Problem  List   Diagnosis Date Noted   Chest pain of uncertain etiology 07/03/2020   Other hyperlipidemia 07/03/2020   Essential hypertension 06/29/2011   Preventative health care 06/27/2011   INSOMNIA-SLEEP DISORDER-UNSPEC 03/29/2010   ALLERGIC RHINITIS 06/04/2009    Past Surgical History:  Procedure Laterality Date   partial index finguer amputation     work accident       Family History  Problem Relation Age of Onset   Hypertension Mother    Diabetes Father     Social History   Tobacco Use   Smoking status: Never   Smokeless tobacco: Never   Tobacco comments:    married x 2 (2011)  Substance Use Topics   Alcohol use: No   Drug use: No    Home Medications Prior to Admission medications   Medication Sig Start Date End Date Taking? Authorizing Provider  aspirin EC 81 MG tablet Take 243 mg by mouth daily. Swallow whole.    [provider]  cetirizine (ZYRTEC) 10 MG tablet Take 10 mg by mouth daily.    [provider]    Allergies    Codeine  Review of Systems   Review of Systems  Gastrointestinal:  Positive for diarrhea.  All other systems reviewed and are negative.  Physical Exam Updated Vital Signs BP (!) 149/98 (BP Location: Left Arm)  Pulse (!) 108   Temp 98.4 F (36.9 C)   Resp 20   Ht 5\' 11"  (1.803 m)   Wt 102.1 kg   SpO2 98%   BMI 31.38 kg/m   Physical Exam Vitals and nursing note reviewed.  Constitutional:      General: He is not in acute distress.    Appearance: He is well-developed.  HENT:     Head: Normocephalic and atraumatic.     Mouth/Throat:     Mouth: Mucous membranes are dry.  Eyes:     Conjunctiva/sclera: Conjunctivae normal.     Pupils: Pupils are equal, round, and reactive to light.  Cardiovascular:     Rate and Rhythm: Regular rhythm. Tachycardia present.     Heart sounds: No murmur heard. Pulmonary:     Effort: Pulmonary effort is normal. No respiratory distress.     Breath sounds: Normal breath sounds. No  wheezing or rales.  Abdominal:     General: There is no distension.     Palpations: Abdomen is soft.     Tenderness: There is no abdominal tenderness. There is no right CVA tenderness, left CVA tenderness, guarding or rebound.  Musculoskeletal:        General: No tenderness. Normal range of motion.     Cervical back: Normal range of motion and neck supple.     Right lower leg: No edema.     Left lower leg: No edema.  Skin:    General: Skin is warm and dry.     Findings: No erythema or rash.  Neurological:     Mental Status: He is alert and oriented to person, place, and time. Mental status is at baseline.  Psychiatric:        Mood and Affect: Mood normal.        Behavior: Behavior normal.    ED Results / Procedures / Treatments   Labs (all labs ordered are listed, but only abnormal results are displayed) Labs Reviewed  BASIC METABOLIC PANEL - Abnormal; Notable for the following components:      Result Value   Glucose, Bld 127 (*)    All other components within normal limits  CBC - Abnormal; Notable for the following components:   WBC 12.1 (*)    All other components within normal limits  URINALYSIS, ROUTINE W REFLEX MICROSCOPIC - Abnormal; Notable for the following components:   Ketones, ur 15 (*)    All other components within normal limits  LIPASE, BLOOD    EKG None  Radiology No results found.  Procedures Procedures   Medications Ordered in ED Medications  ondansetron (ZOFRAN) injection 4 mg (has no administration in time range)  lactated ringers bolus 1,000 mL (has no administration in time range)  ondansetron (ZOFRAN) injection 4 mg (has no administration in time range)    ED Course  I have reviewed the triage vital signs and the nursing notes.  Pertinent labs & imaging results that were available during my care of the patient were reviewed by me and considered in my medical decision making (see chart for details).    MDM Rules/Calculators/A&P                            45 year old male presenting today with recurrent diarrhea and now developing vomiting.  He has benign abdomen on exam.  Concern for possible viral etiology.  Also possible foodborne illness but no known source.  Low suspicion for COVID  as he was +3 weeks ago.  Low suspicion for diverticulitis, appendicitis.  Concern for dehydration and hypokalemia.  Labs are pending.  Patient given IV fluids and antiemetics.  He has already taken Imodium.  Low suspicion for C. difficile at this time.  CBC with leukocytosis of 12,000 but otherwise normal, UA with minimal ketones.  BMP pending.  12:13 AM Labs wnl.  Will d/c home with return precautions.  MDM   Amount and/or Complexity of Data Reviewed Clinical lab tests: ordered and reviewed Independent visualization of images, tracings, or specimens: yes      Final Clinical Impression(s) / ED Diagnoses Final diagnoses:  Diarrhea of presumed infectious origin  Non-intractable vomiting with nausea, unspecified vomiting type    Rx / DC Orders ED Discharge Orders     None        Gwyneth Sprout, MD 04/13/21 [phone]

## 2021-04-12 NOTE — Discharge Instructions (Addendum)
The diarrhea and vomiting you have today is most likely a viral illness or food poisoning.  It most likely will resolve on its own but make sure you are drinking plenty of fluids.  It is okay to take Imodium.  You were given a prescription for nausea medication.  If you continue to have congestion you can start the steroid once all of this has resolved.  It is okay to continue taking your albuterol as needed.

## 2021-04-12 NOTE — ED Triage Notes (Signed)
Patient reports he had COVID x3 weeks ago. Patient reports he was in the heat yesterday and today and was not properly hydrated. Patient reports he is extremely fatigued and feels weak. Patient reports he believes he has heat exhaustion.

## 2022-12-21 ENCOUNTER — Encounter: Payer: Self-pay | Admitting: Family Medicine

## 2022-12-21 ENCOUNTER — Ambulatory Visit: Payer: BC Managed Care – PPO | Admitting: Family Medicine

## 2022-12-21 ENCOUNTER — Other Ambulatory Visit: Payer: Self-pay

## 2022-12-21 VITALS — BP 130/88 | HR 72 | Ht 71.0 in | Wt 217.0 lb

## 2022-12-21 DIAGNOSIS — M7660 Achilles tendinitis, unspecified leg: Secondary | ICD-10-CM | POA: Diagnosis not present

## 2022-12-21 DIAGNOSIS — M7661 Achilles tendinitis, right leg: Secondary | ICD-10-CM

## 2022-12-21 MED ORDER — NITROGLYCERIN 0.2 MG/HR TD PT24: MEDICATED_PATCH | TRANSDERMAL | 1 refills | Status: DC

## 2022-12-21 NOTE — Progress Notes (Unsigned)
   I, Stevenson Clinch, CMA acting as a Neurosurgeon for Clementeen Graham, MD.  Subjective:    CC: Right Achilles pain  HPI: Patient is a 47 year old male presenting with right Achilles pain ongoing .  MOI:old sports injury.  Patient locates pain to medial aspect of the right ankle. Sx worse when running. Sx worsening over the past week, causing abn gait and pain with ambulation. Some swelling present. Will have pain when sports season starts back up. Right now, sx are almost unbearable following activity. At times feels a knot at distal calf.   Swelling: yes Aggravates: running, ambulation Treatments tried: ice, IBU, fascia blaster, Theragun, jet tub  Pertinent review of Systems: No fevers or chills  Relevant historical information: Hypertension.   Objective:    Vitals:   12/21/22 1513  BP: 130/88  Pulse: 72  SpO2: 96%   General: Well Developed, well nourished, and in no acute distress.   MSK: Right Achilles some swelling at distal Achilles tendon.  Nontender palpation this region. Normal foot and ankle motion. Intact strength.  Lab and Radiology Results  Diagnostic Limited MSK Ultrasound of: Right Achilles tendon Enlarged about 2 to 3 cm proximal from insertion site with some hypoechoic change within the tendon fibers without definitive tear. Some increased vascular activity present at Achilles tendon nodule. No definitive tear visible. Mild retrocalcaneal bursitis. Impression: Significant Achilles tendinitis with retrocalcaneal bursitis.  No evidence of current tear.    Impression and Recommendations:    Assessment and Plan: 47 y.o. male with right Achilles tendinitis.  This is a chronic issue with an acute exacerbation.  There is also component of retrocalcaneal bursitis which could be contributory.  Plan for eccentric exercises and nitroglycerin patch protocol.  Also recommend night splint.  Will recheck in about a month.  Consider shockwave if not improving.  PDMP not  reviewed this encounter. Orders Placed This Encounter  Procedures   [place] LIMITED JOINT SPACE STRUCTURES LOW RIGHT(NO LINKED CHARGES)    Order Specific Question:   Reason for Exam (SYMPTOM  OR DIAGNOSIS REQUIRED)    Answer:   right achilles pain    Order Specific Question:   Preferred imaging location?    Answer:   Adult nurse [facility] ordered this encounter  Medications   nitroGLYCERIN (NITRODUR - DOSED IN MG/24 HR) 0.2 mg/hr patch    Sig: Apply 1/4 patch daily to tendon for tendonitis.    Dispense:  30 patch    Refill:  1    Discussed warning signs or symptoms. Please see discharge instructions. Patient expresses understanding.   The above documentation has been reviewed and is accurate and complete Clementeen Graham, M.D.

## 2022-12-21 NOTE — Patient Instructions (Addendum)
Thank you for coming in today.   Nitroglycerin Protocol  Apply 1/4 nitroglycerin patch to affected area daily. Change position of patch within the affected area every 24 hours. You may experience a headache during the first 1-2 weeks of using the patch, these should subside. If you experience headaches after beginning nitroglycerin patch treatment, you may take your preferred over the counter pain reliever. Another side effect of the nitroglycerin patch is skin irritation or rash related to patch adhesive. Please notify our office if you develop more severe headaches or rash, and stop the patch. Tendon healing with nitroglycerin patch may require 12 to 24 weeks depending on the extent of injury. Men should not use if taking Viagra, Cialis, or Levitra.  Do not use if you have migraines or rosacea.   Please complete the exercises that the athletic trainer went over with you:  View at [email or web address]-[email or web address] using code: [email or web address]  Night splint for plantar fasciitis.   Cam Walker boot if you need it.   Recheck in about 1 month.

## 2022-12-22 DIAGNOSIS — M7661 Achilles tendinitis, right leg: Secondary | ICD-10-CM | POA: Insufficient documentation

## 2023-01-18 ENCOUNTER — Ambulatory Visit: Payer: BC Managed Care – PPO | Admitting: Family Medicine

## 2023-01-18 ENCOUNTER — Encounter: Payer: Self-pay | Admitting: Family Medicine

## 2023-01-18 VITALS — BP 144/94 | HR 76 | Ht 71.0 in | Wt 234.6 lb

## 2023-01-18 DIAGNOSIS — M7661 Achilles tendinitis, right leg: Secondary | ICD-10-CM

## 2023-01-18 NOTE — Progress Notes (Unsigned)
   I, Stevenson Clinch, CMA acting as a scribe for Clementeen Graham, MD.  Carlos Whitney is a 47 y.o. male who presents to [facility] today for 49-month f/u R Achilles tendinopathy w/ retrocalcaneal bursitis. Pt was last seen by Dr. Denyse Amass on 12/21/22 and was taught HEP, prescribed nitro patches, and was advised to use night splints. Today, pt reports slow improvement of sx with Nitro Patches and HEP. Has been able to do HEP on single left leg without assistance. Started to develop very mild rash d/t patches, will plan to slightly rotate site of the patch to reduce risk of developing rash. Notes HA for the first 2 days, then HA resolved. Sx most bothersome with first steps or after being more active. Would like to discuss restrictions/limitations while injury is healing.   Pertinent review of systems: No fevers or chills  Relevant historical information: Hypertension   Exam:  BP (!) 144/94   Pulse 76   Ht 5\' 11"  (1.803 m)   Wt 234 lb 9.6 oz (106.4 kg)   SpO2 97%   BMI 32.72 kg/m  General: Well Developed, well nourished, and in no acute distress.   MSK: Right Achilles: Achilles tendon nodule is visible at the posterior calcaneus.  Normal foot and ankle motion. Mildly tender palpation at posterior calcaneus. Intact strength.    Assessment and Plan: 47 y.o. male with right Achilles tendinitis with Achilles tendon nodule.  Improving with eccentric exercises and nitroglycerin patch protocol.  Plan to continue current treatment.  Reassess in about 3 months.  Okay to recheck sooner.  Next step if needed could be extracorporeal shockwave treatment.  Total encounter time 20 minutes including face-to-face time with the patient and, reviewing past medical record, and charting on the date of service.      Discussed warning signs or symptoms. Please see discharge instructions. Patient expresses understanding.   The above documentation has been reviewed and is  accurate and complete Clementeen Graham, M.D.

## 2023-01-18 NOTE — Patient Instructions (Addendum)
Thank you for coming in today.   Keep up the exercises.   Use the nitropatches.   Recheck in 3 months.  Return or contact me sooner if this is not working ok or you have problems.

## 2023-04-10 ENCOUNTER — Ambulatory Visit: Payer: BC Managed Care – PPO | Admitting: Internal Medicine

## 2023-04-20 ENCOUNTER — Ambulatory Visit: Payer: BC Managed Care – PPO | Admitting: Family Medicine

## 2023-05-23 ENCOUNTER — Ambulatory Visit: Payer: BC Managed Care – PPO | Admitting: Family Medicine

## 2023-08-12 ENCOUNTER — Other Ambulatory Visit: Payer: Self-pay | Admitting: Family Medicine

## 2023-08-12 DIAGNOSIS — M7661 Achilles tendinitis, right leg: Secondary | ICD-10-CM

## 2023-08-14 NOTE — Telephone Encounter (Signed)
Last OV: 01/18/23  Next OV: not scheduled

## 2023-09-07 ENCOUNTER — Emergency Department (HOSPITAL_BASED_OUTPATIENT_CLINIC_OR_DEPARTMENT_OTHER)
Admission: EM | Admit: 2023-09-07 | Discharge: 2023-09-07 | Disposition: A | Discharge status: Home / Self Care | Payer: BC Managed Care – PPO | Attending: Emergency Medicine | Admitting: Emergency Medicine

## 2023-09-07 ENCOUNTER — Other Ambulatory Visit: Payer: Self-pay

## 2023-09-07 ENCOUNTER — Encounter (HOSPITAL_BASED_OUTPATIENT_CLINIC_OR_DEPARTMENT_OTHER): Payer: Self-pay | Admitting: Emergency Medicine

## 2023-09-07 DIAGNOSIS — R0789 Other chest pain: Secondary | ICD-10-CM | POA: Insufficient documentation

## 2023-09-07 DIAGNOSIS — R42 Dizziness and giddiness: Secondary | ICD-10-CM | POA: Diagnosis not present

## 2023-09-07 DIAGNOSIS — R002 Palpitations: Secondary | ICD-10-CM | POA: Diagnosis present

## 2023-09-07 DIAGNOSIS — R001 Bradycardia, unspecified: Secondary | ICD-10-CM | POA: Diagnosis not present

## 2023-09-07 DIAGNOSIS — Z7982 Long term (current) use of aspirin: Secondary | ICD-10-CM | POA: Diagnosis not present

## 2023-09-07 DIAGNOSIS — I10 Essential (primary) hypertension: Secondary | ICD-10-CM | POA: Diagnosis not present

## 2023-09-07 LAB — CBC
HCT: 45.6 % (ref 39.0–52.0)
Hemoglobin: 16 g/dL (ref 13.0–17.0)
MCH: 31.1 pg (ref 26.0–34.0)
MCHC: 35.1 g/dL (ref 30.0–36.0)
MCV: 88.7 fL (ref 80.0–100.0)
Platelets: 210 10*3/uL (ref 150–400)
RBC: 5.14 MIL/uL (ref 4.22–5.81)
RDW: 12.1 % (ref 11.5–15.5)
WBC: 8.4 10*3/uL (ref 4.0–10.5)
nRBC: 0 % (ref 0.0–0.2)

## 2023-09-07 LAB — BASIC METABOLIC PANEL
Anion gap: 7 (ref 5–15)
BUN: 15 mg/dL (ref 6–20)
CO2: 29 mmol/L (ref 22–32)
Calcium: 9.1 mg/dL (ref 8.9–10.3)
Chloride: 103 mmol/L (ref 98–111)
Creatinine, Ser: 1.18 mg/dL (ref 0.61–1.24)
GFR, Estimated: >60 mL/min (ref >60)
Glucose, Bld: 97 mg/dL (ref 70–99)
Potassium: 3.8 mmol/L (ref 3.5–5.1)
Sodium: 139 mmol/L (ref 135–145)

## 2023-09-07 LAB — TROPONIN I (HIGH SENSITIVITY): Troponin I (High Sensitivity): 3 ng/L (ref <18)

## 2023-09-07 LAB — MAGNESIUM: Magnesium: 1.8 mg/dL (ref 1.7–2.4)

## 2023-09-07 NOTE — ED Provider Notes (Signed)
[place] [facility]ENC[facility] DEPARTMENT AT [facility] Provider Note   CSN: [id number] Arrival date & time: 09/07/23  0128     History  Chief Complaint  Patient presents with   Chest Pain   Palpitations    Carlos Whitney is a 47 y.o. male.  The history is provided by the patient and the spouse.  Patient with history of hypertension presents with palpitation and mild chest pain.  Patient was at his baseline tonight and drink a Diet Coke before bedtime.  He then began feeling that his heart was skipping a beat and mild chest pain.  No fevers or vomiting.  No diaphoresis.  No shortness of breath.  He reports feeling lightheaded but no syncope He reports similar episodes in the distant past but none recently. He is a non-smoker.  Reports significant stress with family and job and he felt anxious tonight when this occurred.   Past Medical History:  Diagnosis Date   ALLERGIC RHINITIS 06/04/2009   Anxiety    ELEVATED BLOOD PRESSURE WITHOUT DIAGNOSIS OF HYPERTENSION 06/02/2009   HLD (hyperlipidemia)    on statin therapy   HTN (hypertension) 06/29/2011   INSOMNIA-SLEEP DISORDER-UNSPEC 03/29/2010    Home Medications Prior to Admission medications   Medication Sig Start Date End Date Taking? Authorizing Provider  aspirin EC 81 MG tablet Take 243 mg by mouth daily. Swallow whole.    [provider]  cetirizine (ZYRTEC) 10 MG tablet Take 10 mg by mouth daily.    [provider]  fluticasone (FLOVENT HFA) 44 MCG/ACT inhaler Inhale into the lungs. 09/18/20   [provider]  nitroGLYCERIN (NITRODUR - DOSED IN MG/24 HR) 0.2 mg/hr patch Apply 1/4 patch daily to tendon for tendonitis. Try to wean off patches, contact the office with questions. 08/14/23   Rodolph Bong, MD  olmesartan (BENICAR) 20 MG tablet Take 20 mg by mouth daily. 10/11/22   [provider]      Allergies    Patient has no active allergies.    Review of Systems   Review of Systems   Respiratory:  Negative for shortness of breath.   Psychiatric/Behavioral:  Positive for sleep disturbance. The patient is nervous/anxious.     Physical Exam Updated Vital Signs BP (!) 126/93   Pulse (!) 50   Temp 98.1 F (36.7 C) (Oral)   Resp 13   Ht 1.803 m (5\' 11" )   Wt 107 kg   SpO2 98%   BMI 32.92 kg/m  Physical Exam CONSTITUTIONAL: Well developed/well nourished HEAD: Normocephalic/atraumatic ENMT: Mucous membranes moist NECK: supple no meningeal signs CV: S1/S2 noted, no murmurs/rubs/gallops noted LUNGS: Lungs are clear to auscultation bilaterally, no apparent distress ABDOMEN: soft, nontender NEURO: Pt is awake/alert/appropriate, moves all extremitiesx4.  No facial droop.   EXTREMITIES: pulses normal/equal, full ROM, distal pulses equal intact, no lower extremity edema SKIN: warm, color normal PSYCH: no abnormalities of mood noted, alert and oriented to situation  ED Results / Procedures / Treatments   Labs (all labs ordered are listed, but only abnormal results are displayed) Labs Reviewed  BASIC METABOLIC PANEL  CBC  MAGNESIUM  TROPONIN I (HIGH SENSITIVITY)    EKG EKG Interpretation Date/Time:  Thursday September 07 2023 01:35:02 EST Ventricular Rate:  51 PR Interval:  153 QRS Duration:  112 QT Interval:  441 QTC Calculation: 407 R Axis:   -28  Text Interpretation: Sinus rhythm Incomplete right bundle branch block Abnormal R-wave progression, late transition Interpretation limited secondary to artifact  Confirmed by Zadie Rhine ([id number]) on 09/07/2023 1:38:10 AM  Radiology No results found.  Procedures Procedures    Medications Ordered in ED Medications - No data to display  ED Course/ Medical Decision Making/ A&P Clinical Course as of 09/07/23 0327  Thu Sep 07, 2023  3086 Patient presents for episode of palpitations at home.  He is overall well-appearing. He has been monitored in the ER has been no dysrhythmias.  He has mild bradycardia  but no AV block Labs overall reassuring Suspect some of this is being driven by family stressors and anxiety He will follow-up with his PCP next month, may benefit from outpatient Holter monitoring [DW]    Clinical Course User Index [DW] Zadie Rhine, MD             HEART Score: 2                    Medical Decision Making Amount and/or Complexity of Data Reviewed Labs: ordered.   This patient presents to the ED for concern of palpitations, this involves an extensive number of treatment options, and is a complaint that carries with it a high risk of complications and morbidity.  The differential diagnosis includes but is not limited to thyrotoxicosis, SVT, atrial fibrillation, atrial flutter, ventricular tachycardia, sinus tachycardia, anxiety  Comorbidities that complicate the patient evaluation: Patient's presentation is complicated by their history of hypertension  Social Determinants of Health: Patient's  family and job stressors   increases the complexity of managing their presentation  Additional history obtained: Additional history obtained from spouse Records reviewed Primary Care Documents  Lab Tests: I Ordered, and personally interpreted labs.  The pertinent results include: Labs overall reassuring  Cardiac Monitoring: The patient was maintained on a cardiac monitor.  I personally viewed and interpreted the cardiac monitor which showed an underlying rhythm of:  sinus rhythm No dysrhythmias or AV block on telemetry monitoring   Test Considered: Patient is low risk / negative by heart score, therefore do not feel that cardiac admission is indicated.   Reevaluation: After the interventions noted above, I reevaluated the patient and found that they have :improved  Complexity of problems addressed: Patient's presentation is most consistent with  acute presentation with potential threat to life or bodily function  Disposition: After consideration of the  diagnostic results and the patient's response to treatment,  I feel that the patent would benefit from discharge   .           Final Clinical Impression(s) / ED Diagnoses Final diagnoses:  Palpitations    Rx / DC Orders ED Discharge Orders     None         Zadie Rhine, MD 09/07/23 [phone]

## 2023-09-07 NOTE — ED Notes (Signed)
ED Provider at bedside. 

## 2023-09-07 NOTE — ED Triage Notes (Signed)
Pt presents to the ED today for left sided chest pain and intermittent palpitations that started about an hour prior to arrival to the ED. Pt reports SOB when he feels the palpitations. Pt denies any cardiac or respiratory history. Pt denies any fever/chills, N/V/D. Pt is A&Ox4.

## 2023-09-16 ENCOUNTER — Other Ambulatory Visit: Payer: Self-pay | Admitting: Family Medicine

## 2023-09-16 DIAGNOSIS — M7661 Achilles tendinitis, right leg: Secondary | ICD-10-CM

## 2023-09-18 NOTE — Telephone Encounter (Signed)
 Last OV 01/18/23 Next OV not scheduled  Last refill 08/14/23 Qty #30/0
